# Patient Record
Sex: Female | Born: 1950 | Race: White | Hispanic: No | Marital: Married | Smoking: Never smoker
Health system: Southern US, Community
[De-identification: ages and names within clinical notes are randomized; demographics above are authoritative.]

## PROBLEM LIST (undated history)

## (undated) DIAGNOSIS — K529 Noninfective gastroenteritis and colitis, unspecified: Secondary | ICD-10-CM

## (undated) DIAGNOSIS — R2232 Localized swelling, mass and lump, left upper limb: Secondary | ICD-10-CM

## (undated) HISTORY — PX: ABDOMINAL HYSTERECTOMY: SHX81

## (undated) HISTORY — PX: CHOLECYSTECTOMY: SHX55

---
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
## 2006-05-13 ENCOUNTER — Encounter: Admission: RE | Admit: 2006-05-13 | Discharge: 2006-05-13 | Payer: Self-pay | Admitting: Gastroenterology

## 2006-06-03 ENCOUNTER — Encounter (INDEPENDENT_AMBULATORY_CARE_PROVIDER_SITE_OTHER): Payer: Self-pay | Admitting: Specialist

## 2006-06-03 ENCOUNTER — Ambulatory Visit (HOSPITAL_COMMUNITY): Admission: RE | Admit: 2006-06-03 | Discharge: 2006-06-03 | Payer: Self-pay | Admitting: Gastroenterology

## 2006-07-17 ENCOUNTER — Ambulatory Visit (HOSPITAL_COMMUNITY): Admission: RE | Admit: 2006-07-17 | Discharge: 2006-07-17 | Payer: Self-pay | Admitting: Gastroenterology

## 2006-08-06 ENCOUNTER — Encounter: Admission: RE | Admit: 2006-08-06 | Discharge: 2006-08-06 | Payer: Self-pay | Admitting: Gastroenterology

## 2008-07-09 ENCOUNTER — Emergency Department (HOSPITAL_COMMUNITY): Admission: EM | Admit: 2008-07-09 | Discharge: 2008-07-09 | Payer: Self-pay | Admitting: Emergency Medicine

## 2010-06-11 LAB — POCT URINALYSIS DIP (DEVICE)
Nitrite: NEGATIVE
Protein, ur: NEGATIVE mg/dL
Urobilinogen, UA: 0.2 mg/dL (ref 0.0–1.0)
pH: 5 (ref 5.0–8.0)

## 2010-07-19 NOTE — Op Note (Signed)
NAME:  Angela Ortega, Angela Ortega               ACCOUNT NO.:  [id number]   MEDICAL RECORD NO.:  [id number]          PATIENT TYPE:  AMB   LOCATION:  ENDO                         FACILITY:  [facility]   PHYSICIAN:  Anselmo Rod, M.D.  DATE OF BIRTH:  [birth year]   DATE OF PROCEDURE:  06/03/2006  DATE OF DISCHARGE:                               OPERATIVE REPORT   PROCEDURE PERFORMED:  Colonoscopy with multiple cold biopsies.   ENDOSCOPIST:  Anselmo Rod, M.D.   INSTRUMENT USED:  Pentax video colonoscope.   INDICATIONS FOR PROCEDURE:  A 60 year old white female with a history of  abnormal weight loss (30 pounds in the last 9 months) with abdominal  pain and diarrhea, undergoing colonoscopy to rule out colonic polyps,  masses, etc.   PREPROCEDURE PREPARATION:  Informed consent was procured from the  patient.  The patient was fasted for 4 hours prior to the procedure and  prepped with 20 OsmoPrep pills the night of and 12 OsmoPrep pills the  morning of the procedure.  The risks and benefits of the procedure  including a 10% miss rate of cancer and polyps was discussed with the  patient as well.   PREPROCEDURE PHYSICAL:  VITAL SIGNS:  The patient had stable vital  signs.  NECK:  Supple.  CHEST:  Clear to auscultation.  S1, S2 regular.  ABDOMEN:  Soft with normal bowel sounds.   DESCRIPTION OF PROCEDURE:  The patient was placed in the left lateral  decubitus position and sedated with an additional 50 mcg of Fentanyl and  2 mg of Versed given intravenously in slow incremental doses.  Once the  patient was adequately sedated and maintained on low-flow oxygen and  continuous cardiac monitoring, the Pentax video colonoscope was advanced  from the rectum to the cecum.  The appendiceal orifice and ileocecal  valve were clearly visualized and photographed.  After multiple washes  there was some residual stool in the colon.  Multiple random colon  biopsies were done to rule out collagenous colitis.   Sigmoid  diverticulosis was noted.  Retroflexion in the rectum revealed no  abnormalities except for small internal hemorrhoids.  The patient  tolerated the procedure well without complication.  The terminal ileum  appeared normal.   IMPRESSION:  1. Sigmoid diverticulosis.  2. Small internal hemorrhoids.  3. Significant amount of residual stool in the colon.  Small lesions      could be missed.  4. Multiple random colon biopsies done to rule out collagenous      colitis.   RECOMMENDATIONS:  1. Await pathology results.  2. Avoid all nonsteroidals including aspirin for the next 2 weeks.  3. Outpatient follow-up in the next 2 weeks for further      recommendations.      Anselmo Rod, M.D.  Electronically Signed     JNM/MEDQ  D:  06/03/2006  T:  06/03/2006  Job:  [id number]   cc:   Brett Canales A. Cleta Alberts, M.D.

## 2010-07-19 NOTE — Op Note (Signed)
NAME:  Angela Ortega, Angela Ortega               ACCOUNT NO.:  [id number]   MEDICAL RECORD NO.:  [id number]          PATIENT TYPE:  AMB   LOCATION:  ENDO                         FACILITY:  [facility]   PHYSICIAN:  Anselmo Rod, M.D.  DATE OF BIRTH:  [birth year]   DATE OF PROCEDURE:  06/03/2006  DATE OF DISCHARGE:                               OPERATIVE REPORT   PROCEDURE PERFORMED:  Esophagogastroduodenoscopy with multiple cold  biopsies.   ENDOSCOPIST:  Anselmo Rod, M.D.   INSTRUMENT USED:  Pentax video panendoscope.   INDICATIONS FOR PROCEDURE:  A 60 year old white female with a history of  abnormal weight loss and abdominal pain undergoing EGD.  The patient  also had history of change in bowel habits with diarrhea in the recent  past.  Rule out esophagitis, gastritis, peptic ulcer disease celiac  sprue etc.   PREPROCEDURE PREPARATION:  Informed consent was procured from the  patient.  The patient fasted for four hours prior to the procedure.  Risks and benefits of the procedure were discussed with the patient in  great detail.   PREPROCEDURE PHYSICAL:  VITAL SIGNS:  The patient had stable vital  signs.  NECK:  Supple.  CHEST:  Clear to auscultation.  CARDIAC:  S1 and S2 regular.  ABDOMEN:  Soft with normal bowel sounds.   DESCRIPTION OF PROCEDURE:  The patient was placed in left lateral  decubitus position and sedated with 50 mcg of Fentanyl and 5 mg of  Versed given intravenously in slow incremental doses.  Once the patient  was adequately sedated and maintained on low-flow oxygen and continuous  cardiac monitoring, the Pentax video panendoscope was advanced through  the mouthpiece, over the tongue, into the esophagus under direct vision.  The entire esophagus was widely patent with no evidence of ring,  stricture, masses or esophagitis.  Two nodular lesions were seen above  the Z-line and these were biopsied to rule out Barrett's.  The scope was  then advanced into the stomach.   Patchy erythema was noted and round,  flat, reddish lesions were noted throughout the mid body of the stomach  with old heme surrounding them.  These areas were biopsied.  The exact  nature of these lesions is not clear to me.  Retroflexion in the high  cardia revealed no abnormalities.  Proximal small bowel appeared normal,  small bowel biopsies were done to rule out sprue.  The patient tolerated  the procedure well without immediate complications.   IMPRESSION:  1. Two small nodular lesions biopsied in distal esophagus.  2. Patchy erythema in mid body biopsied.  3. Proximal small bowel was normal, small bowel biopsies done to rule      out sprue.   RECOMMENDATIONS:  1. Await pathology results.  2. Avoid all nonsteroidals for now.  3. Proceed with a colonoscopy at this time.  Further recommendations      made thereafter.      Anselmo Rod, M.D.  Electronically Signed     JNM/MEDQ  D:  06/03/2006  T:  06/03/2006  Job:  [id number]   cc:  Stan [place] Cleta Alberts, M.D.

## 2011-03-24 ENCOUNTER — Ambulatory Visit (HOSPITAL_BASED_OUTPATIENT_CLINIC_OR_DEPARTMENT_OTHER)
Admission: RE | Admit: 2011-03-24 | Discharge: 2011-03-24 | Disposition: A | Discharge status: Home / Self Care | Payer: BC Managed Care – PPO | Source: Ambulatory Visit | Attending: Family Medicine | Admitting: Family Medicine

## 2011-03-24 ENCOUNTER — Other Ambulatory Visit: Payer: Self-pay | Admitting: Family Medicine

## 2011-03-24 ENCOUNTER — Ambulatory Visit (INDEPENDENT_AMBULATORY_CARE_PROVIDER_SITE_OTHER): Payer: BC Managed Care – PPO

## 2011-03-24 DIAGNOSIS — R51 Headache: Secondary | ICD-10-CM

## 2011-03-24 DIAGNOSIS — W19XXXA Unspecified fall, initial encounter: Secondary | ICD-10-CM

## 2011-03-24 DIAGNOSIS — R52 Pain, unspecified: Secondary | ICD-10-CM

## 2011-03-24 DIAGNOSIS — G44209 Tension-type headache, unspecified, not intractable: Secondary | ICD-10-CM

## 2011-03-24 DIAGNOSIS — S0990XA Unspecified injury of head, initial encounter: Secondary | ICD-10-CM

## 2011-03-24 DIAGNOSIS — R110 Nausea: Secondary | ICD-10-CM

## 2011-03-24 DIAGNOSIS — R220 Localized swelling, mass and lump, head: Secondary | ICD-10-CM | POA: Insufficient documentation

## 2011-03-24 DIAGNOSIS — R221 Localized swelling, mass and lump, neck: Secondary | ICD-10-CM | POA: Insufficient documentation

## 2012-03-19 DIAGNOSIS — Z0271 Encounter for disability determination: Secondary | ICD-10-CM

## 2012-05-02 ENCOUNTER — Ambulatory Visit (INDEPENDENT_AMBULATORY_CARE_PROVIDER_SITE_OTHER): Payer: BC Managed Care – PPO | Admitting: Family Medicine

## 2012-05-02 VITALS — BP 115/78 | HR 93 | Temp 98.0°F | Resp 16 | Ht 60.0 in | Wt 89.2 lb

## 2012-05-02 DIAGNOSIS — R197 Diarrhea, unspecified: Secondary | ICD-10-CM

## 2012-05-02 DIAGNOSIS — E860 Dehydration: Secondary | ICD-10-CM

## 2012-05-02 DIAGNOSIS — F341 Dysthymic disorder: Secondary | ICD-10-CM

## 2012-05-02 DIAGNOSIS — F418 Other specified anxiety disorders: Secondary | ICD-10-CM

## 2012-05-02 DIAGNOSIS — R634 Abnormal weight loss: Secondary | ICD-10-CM

## 2012-05-02 LAB — POCT UA - MICROSCOPIC ONLY
Casts, Ur, LPF, POC: NEGATIVE
Crystals, Ur, HPF, POC: NEGATIVE
Mucus, UA: NEGATIVE
Yeast, UA: NEGATIVE

## 2012-05-02 LAB — TSH: TSH: 1.411 u[IU]/mL (ref 0.350–4.500)

## 2012-05-02 LAB — POCT CBC
Granulocyte percent: 73.2 %G (ref 37–80)
HCT, POC: 44.6 % (ref 37.7–47.9)
Hemoglobin: 14.2 g/dL (ref 12.2–16.2)
Lymph, poc: 0.9 (ref 0.6–3.4)
MCH, POC: 29.3 pg (ref 27–31.2)
MCHC: 31.8 g/dL (ref 31.8–35.4)
MCV: 92.1 fL (ref 80–97)
MID (cbc): 0.2 (ref 0–0.9)
MPV: 11.3 fL (ref 0–99.8)
POC Granulocyte: 3.1 (ref 2–6.9)
POC LYMPH PERCENT: 21.4 % (ref 10–50)
POC MID %: 5.4 % (ref 0–12)
Platelet Count, POC: 199 10*3/uL (ref 142–424)
RBC: 4.84 M/uL (ref 4.04–5.48)
RDW, POC: 14.9 %
WBC: 4.2 10*3/uL — AB (ref 4.6–10.2)

## 2012-05-02 LAB — POCT URINALYSIS DIPSTICK
Bilirubin, UA: NEGATIVE
Glucose, UA: NEGATIVE
Ketones, UA: NEGATIVE
Nitrite, UA: NEGATIVE
Protein, UA: 80
Spec Grav, UA: 1.01
Urobilinogen, UA: 0.2
pH, UA: 5

## 2012-05-02 LAB — COMPREHENSIVE METABOLIC PANEL
ALT: 19 U/L (ref 0–35)
AST: 21 U/L (ref 0–37)
Albumin: 4.3 g/dL (ref 3.5–5.2)
Alkaline Phosphatase: 42 U/L (ref 39–117)
BUN: 12 mg/dL (ref 6–23)
CO2: 28 mEq/L (ref 19–32)
Calcium: 9.7 mg/dL (ref 8.4–10.5)
Chloride: 104 mEq/L (ref 96–112)
Creat: 0.83 mg/dL (ref 0.50–1.10)
Glucose, Bld: 96 mg/dL (ref 70–99)
Potassium: 3.8 mEq/L (ref 3.5–5.3)
Sodium: 140 mEq/L (ref 135–145)
Total Bilirubin: 0.8 mg/dL (ref 0.3–1.2)
Total Protein: 6.9 g/dL (ref 6.0–8.3)

## 2012-05-02 LAB — GLUCOSE, POCT (MANUAL RESULT ENTRY): POC Glucose: 88 mg/dl (ref 70–99)

## 2012-05-02 MED ORDER — CLONAZEPAM 0.5 MG PO TABS: 0.5000 mg | ORAL_TABLET | Freq: Two times a day (BID) | ORAL | Status: DC | PRN

## 2012-05-02 NOTE — Progress Notes (Signed)
[facility]:  Office Visit  Chief Complaint:  Chief Complaint  Patient presents with  . Diarrhea    on and off since Christmas seeing Dr Collene Mares next week    HPI: Angela Ortega is a 62 y.o. female who complains of acute on chronic nonbloody diarrhea usually after she eats, she has had chronic diarrhea since having her gallbladder removed, however since December she  has had more frequent  Episodes of diarrhea, up to 4 times a day with a 10 lb weightloss.. She has been seeing Dr. Collene Mares for this and was put on cholestyramine. That usually helps. Had a colonscopy which showed diverticualr disease. She sticks to a bland diet. Avoid fatty and sugary foods. Sxs of diarrhea started in [place]., Has had a 10 lb weight loss.   No recent antobiotic use. Mom has been home Dec 13 on hospice with a dx of GBM, she has a severly sxs bipolar brothr who lives with her mother. He has been spiraling down mentally since their mother has gotten sick. Since he lives with her mother, he has been denying hospice care at the hospice when the nurse try to come see their mother, he is having more auditory and visual halluciantions. She is in the process of working out the logisitcs of taking care of her mom, trying to get help for her brother who refuses meds, and also sorting out the financial issues with her mom. She has been on FMLA and that is running out. She has her daughter who is here from [place]  for support.   She is worried about her brother who is bipola but she is also worried about her safety when seh is over there, this is stressing her out. She sleeps with one eye open. He is having hallucinations, auditory and visual. He is not on any meds.   Past Medical History  Diagnosis Date  . Anemia    Past Surgical History  Procedure Laterality Date  . Cholecystectomy    . Abdominal hysterectomy     History   Social History  . Marital Status: Married    Spouse Name: N/A    Number of  Children: N/A  . Years of Education: N/A   Social History Main Topics  . Smoking status: Never Smoker   . Smokeless tobacco: None  . Alcohol Use: No  . Drug Use: No  . Sexually Active: Yes   Other Topics Concern  . None   Social History Narrative  . None   Family History  Problem Relation Age of Onset  . Cancer Father   . Heart disease Maternal Grandfather   . Cancer Paternal Grandfather    Allergies  Allergen Reactions  . Codeine Nausea And Vomiting  . Morphine And Related Nausea And Vomiting  . Penicillins Rash  . Septra (Sulfamethoxazole W-Trimethoprim) Rash   Prior to Admission medications   Medication Sig Start Date End Date Taking? Authorizing Provider  CHOLESTYRAMINE PO Take 9 g by mouth. 1 to 2 daily   Yes Historical Provider, MD     ROS: The patient denies fevers, chills, night sweats, chest pain, palpitations, wheezing, dyspnea on exertion, nausea, vomiting, abdominal pain, dysuria, hematuria, melena, numbness,  or tingling.   All other systems have been reviewed and were otherwise negative with the exception of those mentioned in the HPI and as above.    PHYSICAL EXAM: Filed Vitals:   05/02/12 1043  BP: 115/78  Pulse: 93  Temp: 98  F (36.7 C)  Resp: 16   Filed Vitals:   05/02/12 1043  Height: 5' (1.524 m)  Weight: 89 lb 3.2 oz (40.461 kg)   Body mass index is 17.42 kg/(m^2).  General: Alert, no acute distress, thin white female HEENT:  Normocephalic, atraumatic, oropharynx patent.  Cardiovascular:  Regular rate and rhythm, no rubs murmurs or gallops.  No Carotid bruits, radial pulse intact. No pedal edema.  Respiratory: Clear to auscultation bilaterally.  No wheezes, rales, or rhonchi.  No cyanosis, no use of accessory musculature GI: No organomegaly, abdomen is soft and non-tender, positive bowel sounds.  No masses. Skin: No rashes. Neurologic: Facial musculature symmetric. Psychiatric: Patient is appropriate throughout our  interaction. Lymphatic: No cervical lymphadenopathy Musculoskeletal: Gait intact.   LABS: Results for orders placed in visit on 05/02/12  POCT CBC      Result Value Range   WBC 4.2 (*) 4.6 - 10.2 K/uL   Lymph, poc 0.9  0.6 - 3.4   POC LYMPH PERCENT 21.4  10 - 50 %L   MID (cbc) 0.2  0 - 0.9   POC MID % 5.4  0 - 12 %M   POC Granulocyte 3.1  2 - 6.9   Granulocyte percent 73.2  37 - 80 %G   RBC 4.84  4.04 - 5.48 M/uL   Hemoglobin 14.2  12.2 - 16.2 g/dL   HCT, POC 44.6  37.7 - 47.9 %   MCV 92.1  80 - 97 fL   MCH, POC 29.3  27 - 31.2 pg   MCHC 31.8  31.8 - 35.4 g/dL   RDW, POC 14.9     Platelet Count, POC 199  142 - 424 K/uL   MPV 11.3  0 - 99.8 fL  GLUCOSE, POCT (MANUAL RESULT ENTRY)      Result Value Range   POC Glucose 88  70 - 99 mg/dl     EKG/XRAY:   Primary read interpreted by Dr. Marin Comment at [facility].   ASSESSMENT/PLAN: Encounter Diagnoses  Name Primary?  . Diarrhea Yes  . Depression with anxiety   . Loss of weight    No e/o C. Diff, diverticulitis/losis. I believe most of her increasing diarrhea may be stress related, brother with worsening bipolar sx who is refusing medication, mother in hospice with GBM, and financial duress with her mom's estate. Mom took a second mrtgage out onhouse that she did not know about.  Rx klonopin  She will call me back in 5 days to let me know how she is doing on the klonopin.  I was considering adding on Remeron to see if this may help with possible depression sxs and also help appetite if klonopin does not help. Antidepressants have SEs of diarrhea so wanted to try Klonopin first. Remeron doe snot to appear have diarrhea but SSRIs do.  Her Zung Anxiety Index  score is 49-minimal to moderate anxiety Her Zung Depression score is 47. < 50 for clinical depression F/u prn  Or in 1 month with Dr. Arlington Calix, [facility], DO 05/02/2012 12:08 PM

## 2012-05-06 ENCOUNTER — Ambulatory Visit (INDEPENDENT_AMBULATORY_CARE_PROVIDER_SITE_OTHER): Payer: BC Managed Care – PPO | Admitting: Family Medicine

## 2012-05-06 VITALS — BP 110/78 | HR 77 | Temp 98.0°F | Resp 16 | Ht 59.0 in | Wt 90.0 lb

## 2012-05-06 DIAGNOSIS — R42 Dizziness and giddiness: Secondary | ICD-10-CM

## 2012-05-06 DIAGNOSIS — E860 Dehydration: Secondary | ICD-10-CM

## 2012-05-06 DIAGNOSIS — R110 Nausea: Secondary | ICD-10-CM

## 2012-05-06 MED ORDER — ONDANSETRON 4 MG PO TBDP: 4.0000 mg | ORAL_TABLET | Freq: Three times a day (TID) | ORAL | Status: DC | PRN

## 2012-05-06 MED ORDER — ONDANSETRON 4 MG PO TBDP
4.0000 mg | ORAL_TABLET | Freq: Once | ORAL | Status: AC
  Administered 2012-05-06: 4 mg via ORAL

## 2012-05-06 NOTE — Patient Instructions (Signed)
Diarrhea Infections caused by germs (bacterial) or a virus commonly cause diarrhea. Your caregiver has determined that with time, rest and fluids, the diarrhea should improve. In general, eat normally while drinking more water than usual. Although water may prevent dehydration, it does not contain salt and minerals (electrolytes). Broths, weak tea without caffeine and oral rehydration solutions (ORS) replace fluids and electrolytes. Small amounts of fluids should be taken frequently. Large amounts at one time may not be tolerated. Plain water may be harmful in infants and the elderly. Oral rehydrating solutions (ORS) are available at pharmacies and grocery stores. ORS replace water and important electrolytes in proper proportions. Sports drinks are not as effective as ORS and may be harmful due to sugars worsening diarrhea.  ORS is especially recommended for use in children with diarrhea. As a general guideline for children, replace any new fluid losses from diarrhea and/or vomiting with ORS as follows:  If your child weighs 22 pounds or under (10 kg or less), give 60-120 mL ( -  cup or 2 - 4 ounces) of ORS for each episode of diarrheal stool or vomiting episode.  If your child weighs more than 22 pounds (more than 10 kgs), give 120-240 mL ( - 1 cup or 4 - 8 ounces) of ORS for each diarrheal stool or episode of vomiting.  While correcting for dehydration, children should eat normally. However, foods high in sugar should be avoided because this may worsen diarrhea. Large amounts of carbonated soft drinks, juice, gelatin desserts and other highly sugared drinks should be avoided.  After correction of dehydration, other liquids that are appealing to the child may be added. Children should drink small amounts of fluids frequently and fluids should be increased as tolerated. Children should drink enough fluids to keep urine clear or pale yellow.  Adults should eat normally while drinking more fluids than  usual. Drink small amounts of fluids frequently and increase as tolerated. Drink enough fluids to keep urine clear or pale yellow. Broths, weak decaffeinated tea, lemon lime soft drinks (allowed to go flat) and ORS replace fluids and electrolytes.  Avoid:  Carbonated drinks.  Juice.  Extremely hot or cold fluids.  Caffeine drinks.  Fatty, greasy foods.  Alcohol.  Tobacco.  Too much intake of anything at one time.  Gelatin desserts.  Probiotics are active cultures of beneficial bacteria. They may lessen the amount and number of diarrheal stools in adults. Probiotics can be found in yogurt with active cultures and in supplements.  Wash hands well to avoid spreading bacteria and virus.  Anti-diarrheal medications are not recommended for infants and children.  Only take over-the-counter or prescription medicines for pain, discomfort or fever as directed by your caregiver. Do not give aspirin to children because it may cause Reye's Syndrome.  For adults, ask your caregiver if you should continue all prescribed and over-the-counter medicines.  If your caregiver has given you a follow-up appointment, it is very important to keep that appointment. Not keeping the appointment could result in a chronic or permanent injury, and disability. If there is any problem keeping the appointment, you must call back to this facility for assistance. SEEK IMMEDIATE MEDICAL CARE IF:   You or your child is unable to keep fluids down or other symptoms or problems become worse in spite of treatment.  Vomiting or diarrhea develops and becomes persistent.  There is vomiting of blood or bile (green material).  There is blood in the stool or the stools are black and   tarry.  There is no urine output in 6-8 hours or there is only a small amount of very dark urine.  Abdominal pain develops, increases or localizes.  You have a fever.  Your baby is older than 3 months with a rectal temperature of 102 F  (38.9 C) or higher.  Your baby is 3 months old or younger with a rectal temperature of 100.4 F (38 C) or higher.  You or your child develops excessive weakness, dizziness, fainting or extreme thirst.  You or your child develops a rash, stiff neck, severe headache or become irritable or sleepy and difficult to awaken. MAKE SURE YOU:   Understand these instructions.  Will watch your condition.  Will get help right away if you are not doing well or get worse. Document Released: 02/07/2002 Document Revised: 05/12/2011 Document Reviewed: 12/25/2008 Exit[employer] Patient Information 2013 [employer].  

## 2012-05-06 NOTE — Progress Notes (Signed)
[facility]:  Office Visit  Chief Complaint:  Chief Complaint  Patient presents with  . Follow-up    N&D    HPI: Angela Ortega is a 62 y.o. female who complains of  Here for similar sxs of diarrhea with dizziness and not able to eat. She as dizzy when she was washingher hair and was moving up and down posturally to do this. This was a little but more exacerbated since she had to undergo prep for colonoscopy yesterday, per patient she said Dr. Loreta Ave says her colonoscopy was unchanged from 2008, however in case this is colitis she gave Angela Ortega a sample of budesonide to take for 2 weeks until she is re-evaluated in her office in 2 weeks. . She has had minimal/ to trace bloody mucus tinged diarrhea x 2 episodes after colonoscopy. Per GI nurse this is normal. Last blood tinge diarrhea was last night after colonsocpy. Bx pending. Recents labs were normal except for possible contaminant in urine. She denies having any fevers, chills, urinary sxs.   Past Medical History  Diagnosis Date  . Anemia    Past Surgical History  Procedure Laterality Date  . Cholecystectomy    . Abdominal hysterectomy     History   Social History  . Marital Status: Married    Spouse Name: N/A    Number of Children: N/A  . Years of Education: N/A   Social History Main Topics  . Smoking status: Never Smoker   . Smokeless tobacco: None  . Alcohol Use: No  . Drug Use: No  . Sexually Active: Yes   Other Topics Concern  . None   Social History Narrative  . None   Family History  Problem Relation Age of Onset  . Cancer Father   . Heart disease Maternal Grandfather   . Cancer Paternal Grandfather    Allergies  Allergen Reactions  . Codeine Nausea And Vomiting  . Morphine And Related Nausea And Vomiting  . Penicillins Rash  . Septra (Sulfamethoxazole W-Trimethoprim) Rash   Prior to Admission medications   Medication Sig Start Date End Date Taking? Authorizing Provider   CHOLESTYRAMINE PO Take 9 g by mouth. 1 to 2 daily    Historical Provider, MD  clonazePAM (KLONOPIN) 0.5 MG tablet Take 1 tablet (0.5 mg total) by mouth 2 (two) times daily as needed for anxiety. 05/02/12   Thao P Le, DO     ROS: The patient denies fevers, chills, night sweats, unintentional weight loss, chest pain, palpitations, wheezing, dyspnea on exertion, nausea, vomiting, abdominal pain, dysuria, hematuria, melena, numbness, weakness, or tingling.  All other systems have been reviewed and were otherwise negative with the exception of those mentioned in the HPI and as above.    PHYSICAL EXAM: Filed Vitals:   05/06/12 1659  BP: 110/78  Pulse: 77  Temp: 98 F (36.7 C)  Resp: 16   Filed Vitals:   05/06/12 1659  Height: 4\' 11"  (1.499 m)  Weight: 90 lb (40.824 kg)   Body mass index is 18.17 kg/(m^2).  General: Alert, no acute distress. Thin frail appearing female HEENT:  Normocephalic, atraumatic, oropharynx patent. Oral mucosa dry Cardiovascular:  Regular rate and rhythm, no rubs murmurs or gallops.  No Carotid bruits, radial pulse intact. No pedal edema.  Respiratory: Clear to auscultation bilaterally.  No wheezes, rales, or rhonchi.  No cyanosis, no use of accessory musculature GI: No organomegaly, abdomen is soft and non-tender, positive bowel sounds.  No masses.  Skin: No rashes. Neurologic: Facial musculature symmetric. Psychiatric: Patient is appropriate throughout our interaction. Lymphatic: No cervical lymphadenopathy Musculoskeletal: Gait intact.   LABS: Results for orders placed in visit on 05/02/12  COMPREHENSIVE METABOLIC PANEL      Result Value Range   Sodium 140  135 - 145 mEq/L   Potassium 3.8  3.5 - 5.3 mEq/L   Chloride 104  96 - 112 mEq/L   CO2 28  19 - 32 mEq/L   Glucose, Bld 96  70 - 99 mg/dL   BUN 12  6 - 23 mg/dL   Creat 9.60  4.54 - 0.98 mg/dL   Total Bilirubin 0.8  0.3 - 1.2 mg/dL   Alkaline Phosphatase 42  39 - 117 U/L   AST 21  0 - 37 U/L    ALT 19  0 - 35 U/L   Total Protein 6.9  6.0 - 8.3 g/dL   Albumin 4.3  3.5 - 5.2 g/dL   Calcium 9.7  8.4 - 11.9 mg/dL  TSH      Result Value Range   TSH 1.411  0.350 - 4.500 uIU/mL  POCT CBC      Result Value Range   WBC 4.2 (*) 4.6 - 10.2 K/uL   Lymph, poc 0.9  0.6 - 3.4   POC LYMPH PERCENT 21.4  10 - 50 %L   MID (cbc) 0.2  0 - 0.9   POC MID % 5.4  0 - 12 %M   POC Granulocyte 3.1  2 - 6.9   Granulocyte percent 73.2  37 - 80 %G   RBC 4.84  4.04 - 5.48 M/uL   Hemoglobin 14.2  12.2 - 16.2 g/dL   HCT, POC 14.7  82.9 - 47.9 %   MCV 92.1  80 - 97 fL   MCH, POC 29.3  27 - 31.2 pg   MCHC 31.8  31.8 - 35.4 g/dL   RDW, POC 56.2     Platelet Count, POC 199  142 - 424 K/uL   MPV 11.3  0 - 99.8 fL  POCT UA - MICROSCOPIC ONLY      Result Value Range   WBC, Ur, HPF, POC 1-3     RBC, urine, microscopic 1-3     Bacteria, U Microscopic trace     Mucus, UA neg     Epithelial cells, urine per micros 0-1     Crystals, Ur, HPF, POC neg     Casts, Ur, LPF, POC neg     Yeast, UA neg    POCT URINALYSIS DIPSTICK      Result Value Range   Color, UA yellow     Clarity, UA clear     Glucose, UA neg     Bilirubin, UA neg     Ketones, UA neg     Spec Grav, UA 1.010     Blood, UA trace-lysed     pH, UA 5.0     Protein, UA 80     Urobilinogen, UA 0.2     Nitrite, UA neg     Leukocytes, UA Trace    GLUCOSE, POCT (MANUAL RESULT ENTRY)      Result Value Range   POC Glucose 88  70 - 99 mg/dl     EKG/XRAY:   Primary read interpreted by Dr. Conley Rolls at [facility].   ASSESSMENT/PLAN: Encounter Diagnoses  Name Primary?  . Dehydration Yes  . Nausea alone   . Dizziness and giddiness    NS IVF  x 2 bags--feels better after fluids and also 1x dose of zofran odt No labs today-last CBC, CMP less than 1 week ago was normal, she has no new sxs. Her urine showed trace leuks, I suspect it is a contaminant but will treat with 3 days of Cipro to cover all bases since she is having N/dizziness. Unable to cx at this  time since urine already thrown out.  Rx Zofran ODT Awaiting for colonoscopy bx, results so far no change from prior in 2008 per patient according to what Dr. Loreta Ave told her. F/u with Dr. Loreta Ave in 2 weeks as directed. F/u with Dr. Audria Nine regarding klonopin use.  She is tolerating Klonopin 1 tab PO daily very well.     Hamilton Capri PHUONG, DO 05/06/2012 6:15 PM

## 2012-05-08 MED ORDER — CIPROFLOXACIN HCL 250 MG PO TABS: 250.0000 mg | ORAL_TABLET | Freq: Two times a day (BID) | ORAL | Status: DC

## 2012-05-11 ENCOUNTER — Telehealth: Payer: Self-pay | Admitting: Family Medicine

## 2012-05-11 NOTE — Telephone Encounter (Signed)
Message copied by Gerrianne Scale on Tue May 11, 2012  2:23 PM ------      Message from: LE, [place] P      Created: Thu May 06, 2012  3:46 PM       Please let patient know electrolytes, kidneys,  Liver , thyroid are normal. Please ask her about her klonopin use and also how her visit with Dr. Loreta Ave went.            Thanks,      Tle ------

## 2012-05-11 NOTE — Telephone Encounter (Signed)
Spoke with patient she is much better with the half of tab of klonopin also she doesn't see Dr Loreta Ave until next Monday  She can keep food down and that they finally put her mother into [facility]

## 2012-05-11 NOTE — Telephone Encounter (Signed)
Message copied by Gerrianne Scale on Tue May 11, 2012  2:25 PM ------      Message from: LE, [place] P      Created: Thu May 06, 2012  3:46 PM       Please let patient know electrolytes, kidneys,  Liver , thyroid are normal. Please ask her about her klonopin use and also how her visit with Dr. Loreta Ave went.            Thanks,      Tle ------

## 2012-05-26 ENCOUNTER — Encounter: Payer: Self-pay | Admitting: Emergency Medicine

## 2012-10-13 ENCOUNTER — Encounter: Payer: Self-pay | Admitting: Family Medicine

## 2013-02-07 ENCOUNTER — Ambulatory Visit (INDEPENDENT_AMBULATORY_CARE_PROVIDER_SITE_OTHER): Payer: BC Managed Care – PPO | Admitting: Family Medicine

## 2013-02-07 VITALS — BP 122/80 | HR 65 | Temp 98.0°F | Resp 16 | Ht 60.0 in | Wt 103.0 lb

## 2013-02-07 DIAGNOSIS — S39012A Strain of muscle, fascia and tendon of lower back, initial encounter: Secondary | ICD-10-CM

## 2013-02-07 DIAGNOSIS — F418 Other specified anxiety disorders: Secondary | ICD-10-CM

## 2013-02-07 DIAGNOSIS — F341 Dysthymic disorder: Secondary | ICD-10-CM

## 2013-02-07 DIAGNOSIS — IMO0002 Reserved for concepts with insufficient information to code with codable children: Secondary | ICD-10-CM

## 2013-02-07 DIAGNOSIS — M549 Dorsalgia, unspecified: Secondary | ICD-10-CM

## 2013-02-07 MED ORDER — CLONAZEPAM 0.5 MG PO TABS: 0.5000 mg | ORAL_TABLET | Freq: Two times a day (BID) | ORAL | Status: DC | PRN

## 2013-02-07 NOTE — Progress Notes (Signed)
Chief Complaint:  Chief Complaint  Patient presents with  . Back Pain    lbp x 1 week  . Hip Pain    right more that left    HPI: Angela Ortega is a 62 y.o. female who is here for 1 week history of low back pain, NKI Was sharp with arching and flexing, no urinary or bowel incontinence, no fevers or chils, or saddle anesthesia No weakness. Currently the pain is nagging and dull, and flares up occasionally, she has gotten relief after a good nights sleep when she takes her Klonopin.  Had a tick bite embedded on her left upper arm and was treated with Doxycycline No fever or chills. Target lesion rash is gone.   She works with [employer]--they are currently in a pertussis outbreak. But her vaccines are all UTD.   She has stress and some anxiety, she takes klonopin prn. She states it allows to help her sleep and also relaxes her muscle spasms, she took 2x this week and it helped a lot.   Past Medical History  Diagnosis Date  . Anemia    Past Surgical History  Procedure Laterality Date  . Cholecystectomy    . Abdominal hysterectomy     History   Social History  . Marital Status: Married    Spouse Name: N/A    Number of Children: N/A  . Years of Education: N/A   Social History Main Topics  . Smoking status: Never Smoker   . Smokeless tobacco: None  . Alcohol Use: No  . Drug Use: No  . Sexual Activity: Yes   Other Topics Concern  . None   Social History Narrative  . None   Family History  Problem Relation Age of Onset  . Cancer Father   . Heart disease Maternal Grandfather   . Cancer Paternal Grandfather    Allergies  Allergen Reactions  . Codeine Nausea And Vomiting  . Morphine And Related Nausea And Vomiting  . Penicillins Rash  . Septra [Sulfamethoxazole-Trimethoprim] Rash   Prior to Admission medications   Medication Sig Start Date End Date Taking? Authorizing Provider  CHOLESTYRAMINE PO Take 9 g by mouth. 1 to 2 daily   Yes  Historical Provider, MD  clonazePAM (KLONOPIN) 0.5 MG tablet Take 1 tablet (0.5 mg total) by mouth 2 (two) times daily as needed for anxiety. 05/02/12  Yes Steffie Waggoner P Kayse Puccini, DO  ciprofloxacin (CIPRO) 250 MG tablet Take 1 tablet (250 mg total) by mouth 2 (two) times daily. 05/08/12   Alberto Pina P Phu Record, DO  ondansetron (ZOFRAN ODT) 4 MG disintegrating tablet Take 1 tablet (4 mg total) by mouth every 8 (eight) hours as needed for nausea. 05/06/12   Ainslie Mazurek P Quentavious Rittenhouse, DO     ROS: The patient denies fevers, chills, night sweats, unintentional weight loss, chest pain, palpitations, wheezing, dyspnea on exertion, nausea, vomiting, abdominal pain, dysuria, hematuria, melena, numbness, weakness, or tingling.   All other systems have been reviewed and were otherwise negative with the exception of those mentioned in the HPI and as above.    PHYSICAL EXAM: Filed Vitals:   02/07/13 1315  BP: 122/80  Pulse: 65  Temp: 98 F (36.7 C)  Resp: 16   Filed Vitals:   02/07/13 1315  Height: 5' (1.524 m)  Weight: 103 lb (46.72 kg)   Body mass index is 20.12 kg/(m^2).  General: Alert, no acute distress HEENT:  Normocephalic, atraumatic, oropharynx patent. EOMI, PERRLA  Cardiovascular:  Regular rate and rhythm, no rubs murmurs or gallops.  No Carotid bruits, radial pulse intact. No pedal edema.  Respiratory: Clear to auscultation bilaterally.  No wheezes, rales, or rhonchi.  No cyanosis, no use of accessory musculature GI: No organomegaly, abdomen is soft and non-tender, positive bowel sounds.  No masses. Skin: No rashes. Neurologic: Facial musculature symmetric. Psychiatric: Patient is appropriate throughout our interaction. Lymphatic: No cervical lymphadenopathy Musculoskeletal: Gait intact. Full ROM 5/5 strength, no saddle anesthesia Sensation intact + tender along bilateral paramsk  Neg straight leg   LABS: Results for orders placed in visit on 05/02/12  COMPREHENSIVE METABOLIC PANEL      Result Value Range   Sodium 140   135 - 145 mEq/L   Potassium 3.8  3.5 - 5.3 mEq/L   Chloride 104  96 - 112 mEq/L   CO2 28  19 - 32 mEq/L   Glucose, Bld 96  70 - 99 mg/dL   BUN 12  6 - 23 mg/dL   Creat 8.29  5.62 - 1.30 mg/dL   Total Bilirubin 0.8  0.3 - 1.2 mg/dL   Alkaline Phosphatase 42  39 - 117 U/L   AST 21  0 - 37 U/L   ALT 19  0 - 35 U/L   Total Protein 6.9  6.0 - 8.3 g/dL   Albumin 4.3  3.5 - 5.2 g/dL   Calcium 9.7  8.4 - 86.5 mg/dL  TSH      Result Value Range   TSH 1.411  0.350 - 4.500 uIU/mL  POCT CBC      Result Value Range   WBC 4.2 (*) 4.6 - 10.2 K/uL   Lymph, poc 0.9  0.6 - 3.4   POC LYMPH PERCENT 21.4  10 - 50 %L   MID (cbc) 0.2  0 - 0.9   POC MID % 5.4  0 - 12 %M   POC Granulocyte 3.1  2 - 6.9   Granulocyte percent 73.2  37 - 80 %G   RBC 4.84  4.04 - 5.48 M/uL   Hemoglobin 14.2  12.2 - 16.2 g/dL   HCT, POC 78.4  69.6 - 47.9 %   MCV 92.1  80 - 97 fL   MCH, POC 29.3  27 - 31.2 pg   MCHC 31.8  31.8 - 35.4 g/dL   RDW, POC 29.5     Platelet Count, POC 199  142 - 424 K/uL   MPV 11.3  0 - 99.8 fL  POCT UA - MICROSCOPIC ONLY      Result Value Range   WBC, Ur, HPF, POC 1-3     RBC, urine, microscopic 1-3     Bacteria, U Microscopic trace     Mucus, UA neg     Epithelial cells, urine per micros 0-1     Crystals, Ur, HPF, POC neg     Casts, Ur, LPF, POC neg     Yeast, UA neg    POCT URINALYSIS DIPSTICK      Result Value Range   Color, UA yellow     Clarity, UA clear     Glucose, UA neg     Bilirubin, UA neg     Ketones, UA neg     Spec Grav, UA 1.010     Blood, UA trace-lysed     pH, UA 5.0     Protein, UA 80     Urobilinogen, UA 0.2     Nitrite, UA neg  Leukocytes, UA Trace    GLUCOSE, POCT (MANUAL RESULT ENTRY)      Result Value Range   POC Glucose 88  70 - 99 mg/dl     EKG/XRAY:   Primary read interpreted by Dr. Conley Rolls at [facility].   ASSESSMENT/PLAN: Encounter Diagnoses  Name Primary?  . Back pain Yes  . Sprain and strain of back, initial encounter   . Depression with  anxiety    Msk sprain and strain, she does carry a laptop with her and this may have been the trigger, she is using a rolling bag now.  We discussed the different options of treatment and since the Klonopin did help her, it may be better to just continue with it since it helps with her anxiety.stress and also muscles spasms Refills on  Klonopin F/u prn  Gross sideeffects, risk and benefits, and alternatives of medications d/w patient. Patient is aware that all medications have potential sideeffects and we are unable to predict every sideeffect or drug-drug interaction that may occur.  Hamilton Capri PHUONG, DO 02/07/2013 2:32 PM

## 2013-02-15 ENCOUNTER — Ambulatory Visit (INDEPENDENT_AMBULATORY_CARE_PROVIDER_SITE_OTHER): Payer: BC Managed Care – PPO | Admitting: Family Medicine

## 2013-02-15 ENCOUNTER — Ambulatory Visit: Payer: BC Managed Care – PPO

## 2013-02-15 VITALS — BP 98/62 | HR 76 | Temp 98.0°F | Resp 16 | Ht 60.0 in | Wt 100.0 lb

## 2013-02-15 DIAGNOSIS — G8929 Other chronic pain: Secondary | ICD-10-CM

## 2013-02-15 DIAGNOSIS — R300 Dysuria: Secondary | ICD-10-CM

## 2013-02-15 DIAGNOSIS — R1031 Right lower quadrant pain: Secondary | ICD-10-CM

## 2013-02-15 LAB — POCT CBC
Granulocyte percent: 69.7 %G (ref 37–80)
HCT, POC: 45.3 % (ref 37.7–47.9)
Hemoglobin: 13.5 g/dL (ref 12.2–16.2)
Lymph, poc: 0.9 (ref 0.6–3.4)
MCH, POC: 28.5 pg (ref 27–31.2)
MCHC: 29.8 g/dL — AB (ref 31.8–35.4)
MCV: 95.6 fL (ref 80–97)
MID (cbc): 0.3 (ref 0–0.9)
MPV: 10.9 fL (ref 0–99.8)
POC Granulocyte: 2.9 (ref 2–6.9)
POC LYMPH PERCENT: 22.5 %L (ref 10–50)
POC MID %: 7.8 %M (ref 0–12)
Platelet Count, POC: 140 10*3/uL — AB (ref 142–424)
RBC: 4.74 M/uL (ref 4.04–5.48)
RDW, POC: 14.8 %
WBC: 4.1 10*3/uL — AB (ref 4.6–10.2)

## 2013-02-15 LAB — POCT UA - MICROSCOPIC ONLY
Casts, Ur, LPF, POC: NEGATIVE
Yeast, UA: NEGATIVE

## 2013-02-15 LAB — POCT URINALYSIS DIPSTICK
Glucose, UA: NEGATIVE
Nitrite, UA: NEGATIVE
Urobilinogen, UA: 0.2

## 2013-02-15 NOTE — Progress Notes (Signed)
62 yo with right flank pain x 1 week.  No relief with changing position.  Pain radiates from right flank into right groin  She has had some nausea and diarrhea over the weekend.  No fever or chills, but does feel cold.  Patient works as a Radiation protection practitioner, working at [employer]..  No trauma or change in activity.    S/P cholecystectomy and hysterectomy  Objective:  NAD Chest:  Clear Heart: reg, no murmur Abdomen:  Mild Right sided tenderness with mild rebound, no guarding, no masses or HSM  Results for orders placed in visit on 02/15/13  POCT UA - MICROSCOPIC ONLY      Result Value Range   WBC, Ur, HPF, POC 0-3     RBC, urine, microscopic 0-1     Bacteria, U Microscopic trace     Mucus, UA neg     Epithelial cells, urine per micros 0-1     Crystals, Ur, HPF, POC neg     Casts, Ur, LPF, POC neg     Yeast, UA neg    POCT URINALYSIS DIPSTICK      Result Value Range   Color, UA yellow     Clarity, UA clear     Glucose, UA neg     Bilirubin, UA neg     Ketones, UA neg     Spec Grav, UA 1.010     Blood, UA neg     pH, UA 5.0     Protein, UA neg     Urobilinogen, UA 0.2     Nitrite, UA neg     Leukocytes, UA Negative    POCT CBC      Result Value Range   WBC 4.1 (*) 4.6 - 10.2 K/uL   Lymph, poc 0.9  0.6 - 3.4   POC LYMPH PERCENT 22.5  10 - 50 %L   MID (cbc) 0.3  0 - 0.9   POC MID % 7.8  0 - 12 %M   POC Granulocyte 2.9  2 - 6.9   Granulocyte percent 69.7  37 - 80 %G   RBC 4.74  4.04 - 5.48 M/uL   Hemoglobin 13.5  12.2 - 16.2 g/dL   HCT, POC 47.8  29.5 - 47.9 %   MCV 95.6  80 - 97 fL   MCH, POC 28.5  27 - 31.2 pg   MCHC 29.8 (*) 31.8 - 35.4 g/dL   RDW, POC 62.1     Platelet Count, POC 140 (*) 142 - 424 K/uL   MPV 10.9  0 - 99.8 fL   [facility] reading (PRIMARY) by  Dr. Terrial Rhodes gas pattern, normal spine.  Assessment: Constipation Plan: MiraLax, return if pain continues after 48 hours Signed, Elvina Sidle M.D.

## 2013-02-20 ENCOUNTER — Ambulatory Visit (INDEPENDENT_AMBULATORY_CARE_PROVIDER_SITE_OTHER): Payer: BC Managed Care – PPO | Admitting: Family Medicine

## 2013-02-20 ENCOUNTER — Ambulatory Visit: Payer: BC Managed Care – PPO

## 2013-02-20 VITALS — BP 120/80 | HR 60 | Temp 98.0°F | Resp 16 | Ht 60.0 in | Wt 100.0 lb

## 2013-02-20 DIAGNOSIS — R109 Unspecified abdominal pain: Secondary | ICD-10-CM

## 2013-02-20 DIAGNOSIS — R110 Nausea: Secondary | ICD-10-CM

## 2013-02-20 DIAGNOSIS — R509 Fever, unspecified: Secondary | ICD-10-CM

## 2013-02-20 DIAGNOSIS — K649 Unspecified hemorrhoids: Secondary | ICD-10-CM

## 2013-02-20 DIAGNOSIS — R197 Diarrhea, unspecified: Secondary | ICD-10-CM

## 2013-02-20 LAB — POCT CBC
Granulocyte percent: 68.6 %G (ref 37–80)
HCT, POC: 45.8 % (ref 37.7–47.9)
Hemoglobin: 14.4 g/dL (ref 12.2–16.2)
Lymph, poc: 1.5 (ref 0.6–3.4)
MCH, POC: 29.5 pg (ref 27–31.2)
MCHC: 31.4 g/dL — AB (ref 31.8–35.4)
MCV: 93.8 fL (ref 80–97)
MID (cbc): 0.3 (ref 0–0.9)
MPV: 10.6 fL (ref 0–99.8)
POC Granulocyte: 3.8 (ref 2–6.9)
POC LYMPH PERCENT: 26.1 % (ref 10–50)
POC MID %: 5.3 %M (ref 0–12)
Platelet Count, POC: 191 10*3/uL (ref 142–424)
RBC: 4.88 M/uL (ref 4.04–5.48)
RDW, POC: 14.4 %
WBC: 5.6 10*3/uL (ref 4.6–10.2)

## 2013-02-20 MED ORDER — HYDROCORTISONE 2.5 % RE CREA: 1.0000 "application " | TOPICAL_CREAM | Freq: Two times a day (BID) | RECTAL | Status: DC

## 2013-02-20 NOTE — Progress Notes (Signed)
Chief Complaint:  Chief Complaint  Patient presents with  . Diarrhea    x 1 week off and on  . Nausea  . Fever    low grade  . Hemorrhoids    HPI: Angela Ortega is a 62 y.o. female who is here for diarrhea x1 week. Has had chills but denies fever but today had low grade fever. Was seen by Dr. Milus Glazier 2 weeks ago for back pain, he ordered x ray which showed she may be having constipation with some leakage of stool around impaction. She has been taking miralax. She was told to take Miralax and that would help with that. Has not been able to eat normally due to loose stool. Today she has eaten a few crackers and drank juice. Has noticed hemorrhoid since issue with loose stool. She is tired, not able to eat. She may need FMLA paperwork fileld out for her since she has had this.   Past Medical History  Diagnosis Date  . Anemia    Past Surgical History  Procedure Laterality Date  . Cholecystectomy    . Abdominal hysterectomy     History   Social History  . Marital Status: Married    Spouse Name: N/A    Number of Children: N/A  . Years of Education: N/A   Social History Main Topics  . Smoking status: Never Smoker   . Smokeless tobacco: None  . Alcohol Use: No  . Drug Use: No  . Sexual Activity: Yes   Other Topics Concern  . None   Social History Narrative  . None   Family History  Problem Relation Age of Onset  . Cancer Father   . Heart disease Maternal Grandfather   . Cancer Paternal Grandfather    Allergies  Allergen Reactions  . Codeine Nausea And Vomiting  . Morphine And Related Nausea And Vomiting  . Penicillins Rash  . Septra [Sulfamethoxazole-Trimethoprim] Rash   Prior to Admission medications   Medication Sig Start Date End Date Taking? Authorizing Provider  CHOLESTYRAMINE PO Take 9 g by mouth. 1 to 2 daily    Historical Provider, MD  clonazePAM (KLONOPIN) 0.5 MG tablet Take 1 tablet (0.5 mg total) by mouth 2 (two) times daily as needed for  anxiety. 02/07/13   Thao P Le, DO     ROS: The patient denies  chills, night sweats, unintentional weight loss, chest pain, palpitations, wheezing, dyspnea on exertion, dysuria, hematuria, melena, numbness, weakness, or tingling.   All other systems have been reviewed and were otherwise negative with the exception of those mentioned in the HPI and as above.    PHYSICAL EXAM: Filed Vitals:   02/20/13 1621  BP: 120/80  Pulse: 60  Temp: 98 F (36.7 C)  Resp: 16   Filed Vitals:   02/20/13 1621  Height: 5' (1.524 m)  Weight: 100 lb (45.36 kg)   Body mass index is 19.53 kg/(m^2).  General: Alert, no acute distress HEENT:  Normocephalic, atraumatic, oropharynx patent. EOMI, PERRLA Cardiovascular:  Regular rate and rhythm, no rubs murmurs or gallops.  No Carotid bruits, radial pulse intact. No pedal edema.  Respiratory: Clear to auscultation bilaterally.  No wheezes, rales, or rhonchi.  No cyanosis, no use of accessory musculature GI: No organomegaly, abdomen is soft and non-tender, positive bowel sounds.  No masses. Skin: No rashes. Neurologic: Facial musculature symmetric. Psychiatric: Patient is appropriate throughout our interaction. Lymphatic: No cervical lymphadenopathy Musculoskeletal: Gait intact.   LABS: Results  for orders placed in visit on 02/20/13  POCT CBC      Result Value Range   WBC 5.6  4.6 - 10.2 K/uL   Lymph, poc 1.5  0.6 - 3.4   POC LYMPH PERCENT 26.1  10 - 50 %L   MID (cbc) 0.3  0 - 0.9   POC MID % 5.3  0 - 12 %M   POC Granulocyte 3.8  2 - 6.9   Granulocyte percent 68.6  37 - 80 %G   RBC 4.88  4.04 - 5.48 M/uL   Hemoglobin 14.4  12.2 - 16.2 g/dL   HCT, POC 16.1  09.6 - 47.9 %   MCV 93.8  80 - 97 fL   MCH, POC 29.5  27 - 31.2 pg   MCHC 31.4 (*) 31.8 - 35.4 g/dL   RDW, POC 04.5     Platelet Count, POC 191  142 - 424 K/uL   MPV 10.6  0 - 99.8 fL     EKG/XRAY:   Primary read interpreted by Dr. Conley Rolls at [facility] No free air, no  obstruction   ASSESSMENT/PLAN: Encounter Diagnoses  Name Primary?  . Diarrhea Yes  . Fever   . Nausea alone   . Abdominal pain     I think her xrays show some movement of her stool She continues to have diarrhea and loose stools We will go ahead and put her back on the cholysteramine and see if lees frequent BM will help She will also take her klonopin 1/2 tab to help with sleep She is planning to travel to see her daughter on Wednesday so would like to feel better.  F/u prn, I will try to call her in 2 days.  Gross sideeffects, risk and benefits, and alternatives of medications d/w patient. Patient is aware that all medications have potential sideeffects and we are unable to predict every sideeffect or drug-drug interaction that may occur.  LE, THAO PHUONG, DO 02/20/2013 5:46 PM  02/22/13 LM to see how she is doing. Xrays are negative.  FINDINGS:  Surgical clips in the right upper abdomen. Bilateral pelvic  phleboliths. Small bowel and colon are not nondilated. No  pneumatosis identified. Horizontal beam radiograph would be required  to accurately assess for free air. Regional bones unremarkable.  IMPRESSION:  Nonobstructive bowel gas pattern.

## 2013-02-23 ENCOUNTER — Encounter: Payer: Self-pay | Admitting: Family Medicine

## 2013-10-01 DIAGNOSIS — R2232 Localized swelling, mass and lump, left upper limb: Secondary | ICD-10-CM

## 2013-10-01 HISTORY — DX: Localized swelling, mass and lump, left upper limb: R22.32

## 2013-10-19 ENCOUNTER — Ambulatory Visit (INDEPENDENT_AMBULATORY_CARE_PROVIDER_SITE_OTHER): Payer: BC Managed Care – PPO | Admitting: Family Medicine

## 2013-10-19 VITALS — BP 102/68 | HR 72 | Temp 98.5°F | Resp 18 | Ht 60.0 in | Wt 99.2 lb

## 2013-10-19 DIAGNOSIS — R509 Fever, unspecified: Secondary | ICD-10-CM

## 2013-10-19 DIAGNOSIS — R51 Headache: Secondary | ICD-10-CM

## 2013-10-19 DIAGNOSIS — L989 Disorder of the skin and subcutaneous tissue, unspecified: Secondary | ICD-10-CM

## 2013-10-19 DIAGNOSIS — Z8261 Family history of arthritis: Secondary | ICD-10-CM

## 2013-10-19 DIAGNOSIS — M25579 Pain in unspecified ankle and joints of unspecified foot: Secondary | ICD-10-CM

## 2013-10-19 DIAGNOSIS — T148 Other injury of unspecified body region: Secondary | ICD-10-CM

## 2013-10-19 DIAGNOSIS — W57XXXA Bitten or stung by nonvenomous insect and other nonvenomous arthropods, initial encounter: Secondary | ICD-10-CM

## 2013-10-19 LAB — POCT CBC
Granulocyte percent: 69.9 %G (ref 37–80)
HCT, POC: 42.4 % (ref 37.7–47.9)
Hemoglobin: 13.8 g/dL (ref 12.2–16.2)
Lymph, poc: 1.1 (ref 0.6–3.4)
MCH, POC: 29.5 pg (ref 27–31.2)
MCHC: 32.4 g/dL (ref 31.8–35.4)
MCV: 90.9 fL (ref 80–97)
MID (cbc): 0.4 (ref 0–0.9)
MPV: 8.6 fL (ref 0–99.8)
POC Granulocyte: 3.3 (ref 2–6.9)
POC LYMPH PERCENT: 22.4 %L (ref 10–50)
POC MID %: 7.7 % (ref 0–12)
Platelet Count, POC: 182 10*3/uL (ref 142–424)
RBC: 4.67 M/uL (ref 4.04–5.48)
RDW, POC: 14.7 %
WBC: 4.7 10*3/uL (ref 4.6–10.2)

## 2013-10-19 LAB — POCT SEDIMENTATION RATE: POCT SED RATE: 30 mm/hr — AB (ref 0–22)

## 2013-10-19 NOTE — Progress Notes (Signed)
 Chief Complaint:  Chief Complaint  Patient presents with  . Fever    102 since friday   . Generalized Body Aches  . Dizziness    HPI: Angela Ortega is a 63 y.o. female who is here for 1 week hisotry of fevers and worsening msk aches.  No recent travels, has had 2 tick bites in [place] and then 1 in November , she had a had a NP at her work removed.  She did not get medicines for either. She cleaned it off with sopa and water.  She does not remember. She has joitn aches, she had generalized msk aches. She has had HA.   She had Tmax of 102, she ahs been taking tylenol for HA without releif, she then took ibupfren and that really upsets her stomachm she got some releif from exc No rachses, no neck pain. She had some weakness in her joints.  No new travels outside of country.  She has had some weakness and msk aches about 1 month ago.   Past Medical History  Diagnosis Date  . Anemia    Past Surgical History  Procedure Laterality Date  . Cholecystectomy    . Abdominal hysterectomy     History   Social History  . Marital Status: Married    Spouse Name: N/A    Number of Children: N/A  . Years of Education: N/A   Social History Main Topics  . Smoking status: Never Smoker   . Smokeless tobacco: None  . Alcohol Use: No  . Drug Use: No  . Sexual Activity: Yes   Other Topics Concern  . None   Social History Narrative  . None   Family History  Problem Relation Age of Onset  . Cancer Father   . Heart disease Maternal Grandfather   . Cancer Paternal Grandfather    Allergies  Allergen Reactions  . Codeine Nausea And Vomiting  . Morphine And Related Nausea And Vomiting  . Penicillins Rash  . Septra [Sulfamethoxazole-Trimethoprim] Rash   Prior to Admission medications   Medication Sig Start Date End Date Taking? Authorizing Provider  CHOLESTYRAMINE PO Take 9 g by mouth. 1 to 2 daily   Yes Historical Provider, MD  clonazePAM (KLONOPIN) 0.5 MG tablet Take 1  tablet (0.5 mg total) by mouth 2 (two) times daily as needed for anxiety. 02/07/13    P , DO  hydrocortisone (ANUSOL-HC) 2.5 % rectal cream Place 1 application rectally 2 (two) times daily. 02/20/13    P , DO     ROS: The patient denies  chills, night sweats, unintentional weight loss, chest pain, palpitations, wheezing, dyspnea on exertion, nausea, vomiting, abdominal pain, dysuria, hematuria, melena, numbness, , or tingling.   All other systems have been reviewed and were otherwise negative with the exception of those mentioned in the HPI and as above.    PHYSICAL EXAM: Filed Vitals:   10/19/13 1842  BP: 102/68  Pulse: 72  Temp: 98.5 F (36.9 C)  Resp: 18   Filed Vitals:   10/19/13 1842  Height: 5' (1.524 m)  Weight: 99 lb 3.2 oz (44.997 kg)   Body mass index is 19.37 kg/(m^2).  General: Alert, no acute distress HEENT:  Normocephalic, atraumatic, oropharynx patent. EOMI, PERRLA Cardiovascular:  Regular rate and rhythm, no rubs murmurs or gallops.  No Carotid bruits, radial pulse intact. No pedal edema.  Respiratory: Clear to auscultation bilaterally.  No wheezes, rales, or rhonchi.  No cyanosis, no  use of accessory musculature GI: No organomegaly, abdomen is soft and non-tender, positive bowel sounds.  No masses. Skin: No rashes. Neurologic: Facial musculature symmetric. Psychiatric: Patient is appropriate throughout our interaction. Cn 2-12 grossly nl Lymphatic: No cervical lymphadenopathy Musculoskeletal: Gait intact. 5/5 strength, 2/2 DTRS, no meningeal signs ft ring  Finger-soft tissue tumor ? Fatty tissue vs ganglion cyst full ROM, strength and sensation   LABS: Results for orders placed in visit on 10/19/13  POCT CBC      Result Value Ref Range   WBC 4.7  4.6 - 10.2 K/uL   Lymph, poc 1.1  0.6 - 3.4   POC LYMPH PERCENT 22.4  10 - 50 %L   MID (cbc) 0.4  0 - 0.9   POC MID % 7.7  0 - 12 %M   POC Granulocyte 3.3  2 - 6.9   Granulocyte percent 69.9  37 -  80 %G   RBC 4.67  4.04 - 5.48 M/uL   Hemoglobin 13.8  12.2 - 16.2 g/dL   HCT, POC 42.4  37.7 - 47.9 %   MCV 90.9  80 - 97 fL   MCH, POC 29.5  27 - 31.2 pg   MCHC 32.4  31.8 - 35.4 g/dL   RDW, POC 14.7     Platelet Count, POC 182  142 - 424 K/uL   MPV 8.6  0 - 99.8 fL     EKG/XRAY:   Primary read interpreted by Dr. Marin Comment at [facility].   ASSESSMENT/PLAN: Encounter Diagnoses  Name Primary?  . Headache(784.0) Yes  . Tick bite   . Fever, unspecified   . Pain in joint, ankle and foot, unspecified laterality   . Family history of rheumatoid arthritis   . Hand lesion    Labs pending She has a hsitory of a very sensitive stomach and many meds make her have diarrhea, will wait for labs to see if she needs treatment for tickborne illnesses She is taking Excedrin and will cont with that and push fluids Refer to hand surgery since she would like to wear her wedding ring, ? Fatty tunor vs ganglion cyst Labs pending F/u by phone or prn  Gross sideeffects, risk and benefits, and alternatives of medications d/w patient. Patient is aware that all medications have potential sideeffects and we are unable to predict every sideeffect or drug-drug interaction that may occur.  , [place], DO 10/19/2013 7:38 PM

## 2013-10-20 LAB — COMPLETE METABOLIC PANEL WITHOUT GFR
ALT: 23 U/L (ref 0–35)
AST: 22 U/L (ref 0–37)
Creat: 1.08 mg/dL (ref 0.50–1.10)
Sodium: 140 meq/L (ref 135–145)
Total Bilirubin: 0.4 mg/dL (ref 0.2–1.2)

## 2013-10-20 LAB — COMPLETE METABOLIC PANEL WITH GFR
Albumin: 4.5 g/dL (ref 3.5–5.2)
Alkaline Phosphatase: 48 U/L (ref 39–117)
BUN: 26 mg/dL — ABNORMAL HIGH (ref 6–23)
CO2: 28 mEq/L (ref 19–32)
Calcium: 9.4 mg/dL (ref 8.4–10.5)
Chloride: 102 mEq/L (ref 96–112)
GFR, Est African American: 64 mL/min
GFR, Est Non African American: 56 mL/min — ABNORMAL LOW
Glucose, Bld: 94 mg/dL (ref 70–99)
Potassium: 4.1 mEq/L (ref 3.5–5.3)
Total Protein: 7.4 g/dL (ref 6.0–8.3)

## 2013-10-20 LAB — RHEUMATOID FACTOR: Rheumatoid fact SerPl-aCnc: <10 [IU]/mL (ref <=14)

## 2013-10-21 LAB — ROCKY MTN SPOTTED FVR ABS PNL(IGG+IGM)
RMSF IgG: 0.13 IV
RMSF IgM: 0.33 IV

## 2013-10-21 LAB — B. BURGDORFI ANTIBODIES: B burgdorferi Ab IgG+IgM: 0.38 {ISR}

## 2013-10-26 ENCOUNTER — Encounter: Payer: Self-pay | Admitting: Family Medicine

## 2013-10-26 DIAGNOSIS — W57XXXA Bitten or stung by nonvenomous insect and other nonvenomous arthropods, initial encounter: Secondary | ICD-10-CM

## 2013-10-27 MED ORDER — DOXYCYCLINE HYCLATE 100 MG PO TABS: 100.0000 mg | ORAL_TABLET | Freq: Two times a day (BID) | ORAL | Status: DC

## 2013-10-28 ENCOUNTER — Other Ambulatory Visit: Payer: Self-pay | Admitting: Orthopedic Surgery

## 2013-10-31 ENCOUNTER — Encounter (HOSPITAL_BASED_OUTPATIENT_CLINIC_OR_DEPARTMENT_OTHER): Payer: Self-pay | Admitting: *Deleted

## 2013-11-03 ENCOUNTER — Ambulatory Visit (HOSPITAL_BASED_OUTPATIENT_CLINIC_OR_DEPARTMENT_OTHER): Payer: BC Managed Care – PPO | Admitting: Certified Registered"

## 2013-11-03 ENCOUNTER — Ambulatory Visit (HOSPITAL_BASED_OUTPATIENT_CLINIC_OR_DEPARTMENT_OTHER)
Admission: RE | Admit: 2013-11-03 | Discharge: 2013-11-03 | Disposition: A | Discharge status: Home / Self Care | Payer: BC Managed Care – PPO | Source: Ambulatory Visit | Attending: Orthopedic Surgery | Admitting: Orthopedic Surgery

## 2013-11-03 ENCOUNTER — Encounter (HOSPITAL_BASED_OUTPATIENT_CLINIC_OR_DEPARTMENT_OTHER): Payer: BC Managed Care – PPO | Admitting: Certified Registered"

## 2013-11-03 ENCOUNTER — Encounter (HOSPITAL_BASED_OUTPATIENT_CLINIC_OR_DEPARTMENT_OTHER): Payer: Self-pay | Admitting: Certified Registered"

## 2013-11-03 ENCOUNTER — Encounter (HOSPITAL_BASED_OUTPATIENT_CLINIC_OR_DEPARTMENT_OTHER)
Admission: RE | Disposition: A | Discharge status: Home / Self Care | Payer: Self-pay | Source: Ambulatory Visit | Attending: Orthopedic Surgery

## 2013-11-03 DIAGNOSIS — R29898 Other symptoms and signs involving the musculoskeletal system: Secondary | ICD-10-CM | POA: Diagnosis present

## 2013-11-03 DIAGNOSIS — D2110 Benign neoplasm of connective and other soft tissue of unspecified upper limb, including shoulder: Secondary | ICD-10-CM | POA: Insufficient documentation

## 2013-11-03 HISTORY — DX: Localized swelling, mass and lump, left upper limb: R22.32

## 2013-11-03 HISTORY — DX: Noninfective gastroenteritis and colitis, unspecified: K52.9

## 2013-11-03 HISTORY — PX: MASS EXCISION: SHX2000

## 2013-11-03 LAB — POCT HEMOGLOBIN-HEMACUE: Hemoglobin: 13.4 g/dL (ref 12.0–15.0)

## 2013-11-03 SURGERY — EXCISION MASS
Anesthesia: Monitor Anesthesia Care | Site: Finger | Laterality: Left

## 2013-11-03 MED ORDER — MEPERIDINE HCL 25 MG/ML IJ SOLN: 6.2500 mg | INTRAMUSCULAR | Status: DC | PRN

## 2013-11-03 MED ORDER — PROPOFOL 10 MG/ML IV BOLUS
INTRAVENOUS | Status: DC | PRN
  Administered 2013-11-03: 15 mg via INTRAVENOUS

## 2013-11-03 MED ORDER — FENTANYL CITRATE 0.05 MG/ML IJ SOLN
INTRAMUSCULAR | Status: AC
  Filled 2013-11-03: qty 4

## 2013-11-03 MED ORDER — MIDAZOLAM HCL 2 MG/2ML IJ SOLN: 1.0000 mg | INTRAMUSCULAR | Status: DC | PRN

## 2013-11-03 MED ORDER — LIDOCAINE HCL (PF) 0.5 % IJ SOLN
INTRAMUSCULAR | Status: DC | PRN
  Administered 2013-11-03: 25 mL via INTRAVENOUS

## 2013-11-03 MED ORDER — PROMETHAZINE HCL 25 MG/ML IJ SOLN: 6.2500 mg | INTRAMUSCULAR | Status: DC | PRN

## 2013-11-03 MED ORDER — VANCOMYCIN HCL IN DEXTROSE 1-5 GM/200ML-% IV SOLN
1000.0000 mg | INTRAVENOUS | Status: AC
  Administered 2013-11-03: 1000 mg via INTRAVENOUS

## 2013-11-03 MED ORDER — LACTATED RINGERS IV SOLN
INTRAVENOUS | Status: DC
  Administered 2013-11-03: 10:00:00 via INTRAVENOUS

## 2013-11-03 MED ORDER — BUPIVACAINE HCL (PF) 0.25 % IJ SOLN
INTRAMUSCULAR | Status: AC
  Filled 2013-11-03: qty 30

## 2013-11-03 MED ORDER — MIDAZOLAM HCL 2 MG/ML PO SYRP: 12.0000 mg | ORAL_SOLUTION | Freq: Once | ORAL | Status: DC | PRN

## 2013-11-03 MED ORDER — VANCOMYCIN HCL IN DEXTROSE 1-5 GM/200ML-% IV SOLN
INTRAVENOUS | Status: AC
  Filled 2013-11-03: qty 200

## 2013-11-03 MED ORDER — CHLORHEXIDINE GLUCONATE 4 % EX LIQD: 60.0000 mL | Freq: Once | CUTANEOUS | Status: DC

## 2013-11-03 MED ORDER — HYDROCODONE-ACETAMINOPHEN 5-325 MG PO TABS: ORAL_TABLET | ORAL | Status: DC

## 2013-11-03 MED ORDER — FENTANYL CITRATE 0.05 MG/ML IJ SOLN
INTRAMUSCULAR | Status: DC | PRN
  Administered 2013-11-03: 50 ug via INTRAVENOUS

## 2013-11-03 MED ORDER — ONDANSETRON HCL 4 MG/2ML IJ SOLN
INTRAMUSCULAR | Status: DC | PRN
Start: 2013-11-03 — End: 2013-11-03
  Administered 2013-11-03: 3 mg via INTRAVENOUS

## 2013-11-03 MED ORDER — MIDAZOLAM HCL 5 MG/5ML IJ SOLN
INTRAMUSCULAR | Status: DC | PRN
  Administered 2013-11-03: 1 mg via INTRAVENOUS

## 2013-11-03 MED ORDER — PROPOFOL 10 MG/ML IV BOLUS
INTRAVENOUS | Status: AC
  Filled 2013-11-03: qty 40

## 2013-11-03 MED ORDER — OXYCODONE HCL 5 MG/5ML PO SOLN: 5.0000 mg | Freq: Once | ORAL | Status: DC | PRN

## 2013-11-03 MED ORDER — FENTANYL CITRATE 0.05 MG/ML IJ SOLN: 25.0000 ug | INTRAMUSCULAR | Status: DC | PRN

## 2013-11-03 MED ORDER — MIDAZOLAM HCL 2 MG/2ML IJ SOLN
INTRAMUSCULAR | Status: AC
  Filled 2013-11-03: qty 2

## 2013-11-03 MED ORDER — FENTANYL CITRATE 0.05 MG/ML IJ SOLN: 50.0000 ug | INTRAMUSCULAR | Status: DC | PRN

## 2013-11-03 MED ORDER — BUPIVACAINE HCL (PF) 0.25 % IJ SOLN
INTRAMUSCULAR | Status: DC | PRN
  Administered 2013-11-03: 2 mL

## 2013-11-03 MED ORDER — OXYCODONE HCL 5 MG PO TABS: 5.0000 mg | ORAL_TABLET | Freq: Once | ORAL | Status: DC | PRN

## 2013-11-03 SURGICAL SUPPLY — 53 items
APL SKNCLS STERI-STRIP NONHPOA (GAUZE/BANDAGES/DRESSINGS)
BANDAGE COBAN STERILE 2 (GAUZE/BANDAGES/DRESSINGS) IMPLANT
BANDAGE ELASTIC 3 VELCRO ST LF (GAUZE/BANDAGES/DRESSINGS) IMPLANT
BENZOIN TINCTURE PRP APPL 2/3 (GAUZE/BANDAGES/DRESSINGS) IMPLANT
BLADE MINI RND TIP GREEN BEAV (BLADE) IMPLANT
BLADE SURG 15 STRL LF DISP TIS (BLADE) ×2 IMPLANT
BLADE SURG 15 STRL SS (BLADE) ×4
BNDG CMPR 9X4 STRL LF SNTH (GAUZE/BANDAGES/DRESSINGS)
BNDG CMPR MD 5X2 ELC HKLP STRL (GAUZE/BANDAGES/DRESSINGS)
BNDG COHESIVE 1X5 TAN STRL LF (GAUZE/BANDAGES/DRESSINGS) IMPLANT
BNDG CONFORM 2 STRL LF (GAUZE/BANDAGES/DRESSINGS) IMPLANT
BNDG ELASTIC 2 VLCR STRL LF (GAUZE/BANDAGES/DRESSINGS) IMPLANT
BNDG ESMARK 4X9 LF (GAUZE/BANDAGES/DRESSINGS) IMPLANT
BNDG GAUZE 1X2.1 STRL (MISCELLANEOUS) IMPLANT
BNDG GAUZE ELAST 4 BULKY (GAUZE/BANDAGES/DRESSINGS) IMPLANT
BNDG PLASTER X FAST 3X3 WHT LF (CAST SUPPLIES) IMPLANT
BNDG PLSTR 9X3 FST ST WHT (CAST SUPPLIES)
CHLORAPREP W/TINT 26ML (MISCELLANEOUS) ×2 IMPLANT
CORDS BIPOLAR (ELECTRODE) ×2 IMPLANT
COVER MAYO STAND STRL (DRAPES) ×2 IMPLANT
COVER TABLE BACK 60X90 (DRAPES) ×2 IMPLANT
CUFF TOURNIQUET SINGLE 18IN (TOURNIQUET CUFF) ×2 IMPLANT
DRAPE EXTREMITY T 121X128X90 (DRAPE) ×2 IMPLANT
DRAPE SURG 17X23 STRL (DRAPES) ×2 IMPLANT
GAUZE SPONGE 4X4 12PLY STRL (GAUZE/BANDAGES/DRESSINGS) ×2 IMPLANT
GAUZE XEROFORM 1X8 LF (GAUZE/BANDAGES/DRESSINGS) ×2 IMPLANT
GLOVE BIO SURGEON STRL SZ7.5 (GLOVE) ×2 IMPLANT
GLOVE BIOGEL PI IND STRL 8 (GLOVE) ×1 IMPLANT
GLOVE BIOGEL PI INDICATOR 8 (GLOVE) ×1
GOWN STRL REUS W/ TWL LRG LVL3 (GOWN DISPOSABLE) ×1 IMPLANT
GOWN STRL REUS W/TWL LRG LVL3 (GOWN DISPOSABLE) ×2
GOWN STRL REUS W/TWL XL LVL3 (GOWN DISPOSABLE) ×2 IMPLANT
NDL HYPO 25X1 1.5 SAFETY (NEEDLE) ×1 IMPLANT
NEEDLE HYPO 25X1 1.5 SAFETY (NEEDLE) ×2 IMPLANT
NS IRRIG 1000ML POUR BTL (IV SOLUTION) ×2 IMPLANT
PACK BASIN DAY SURGERY FS (CUSTOM PROCEDURE TRAY) ×2 IMPLANT
PAD CAST 3X4 CTTN HI CHSV (CAST SUPPLIES) IMPLANT
PAD CAST 4YDX4 CTTN HI CHSV (CAST SUPPLIES) IMPLANT
PADDING CAST ABS 4INX4YD NS (CAST SUPPLIES) ×1
PADDING CAST ABS COTTON 4X4 ST (CAST SUPPLIES) ×1 IMPLANT
PADDING CAST COTTON 3X4 STRL (CAST SUPPLIES)
PADDING CAST COTTON 4X4 STRL (CAST SUPPLIES)
STOCKINETTE 4X48 STRL (DRAPES) ×2 IMPLANT
STRIP CLOSURE SKIN 1/2X4 (GAUZE/BANDAGES/DRESSINGS) IMPLANT
SUT ETHILON 3 0 PS 1 (SUTURE) IMPLANT
SUT ETHILON 4 0 PS 2 18 (SUTURE) ×2 IMPLANT
SUT ETHILON 5 0 P 3 18 (SUTURE)
SUT NYLON ETHILON 5-0 P-3 1X18 (SUTURE) IMPLANT
SUT VIC AB 4-0 P2 18 (SUTURE) IMPLANT
SYR BULB 3OZ (MISCELLANEOUS) ×2 IMPLANT
SYR CONTROL 10ML LL (SYRINGE) ×2 IMPLANT
TOWEL OR 17X24 6PK STRL BLUE (TOWEL DISPOSABLE) ×4 IMPLANT
UNDERPAD 30X30 INCONTINENT (UNDERPADS AND DIAPERS) ×2 IMPLANT

## 2013-11-03 NOTE — Brief Op Note (Signed)
11/03/2013  12:39 PM  PATIENT:  ZIVAH MAYR  63 y.o. female  PRE-OPERATIVE DIAGNOSIS:  left ring finger mass  POST-OPERATIVE DIAGNOSIS:  left ring finger mass  PROCEDURE:  Procedure(s): EXCISION MASS LEFT RING FINGER (Left)  SURGEON:  Surgeon(s) and Role:    * [name], MD - Primary  PHYSICIAN ASSISTANT:   ASSISTANTS: none   ANESTHESIA:   Bier block  EBL:  Total I/O In: 500 [I.V.:500] Out: -   BLOOD ADMINISTERED:none  DRAINS: none   LOCAL MEDICATIONS USED:  MARCAINE     SPECIMEN:  Source of Specimen:  left ring finger  DISPOSITION OF SPECIMEN:  PATHOLOGY  COUNTS:  YES  TOURNIQUET:   Total Tourniquet Time Documented: Forearm (Left) - 22 minutes Total: Forearm (Left) - 22 minutes   DICTATION: .Other Dictation: Dictation Number [id number]  PLAN OF CARE: Discharge to home after PACU  PATIENT DISPOSITION:  PACU - hemodynamically stable.

## 2013-11-03 NOTE — Discharge Instructions (Addendum)

## 2013-11-03 NOTE — Transfer of Care (Signed)
Immediate Anesthesia Transfer of Care Note  Patient: Angela Ortega  Procedure(s) Performed: Procedure(s): EXCISION MASS LEFT RING FINGER (Left)  Patient Location: PACU  Anesthesia Type:MAC and Bier block  Level of Consciousness: awake, alert  and oriented  Airway & Oxygen Therapy: Patient Spontanous Breathing  Post-op Assessment: Report given to PACU RN, Post -op Vital signs reviewed and stable and Patient moving all extremities  Post vital signs: Reviewed and stable  Complications: No apparent anesthesia complications

## 2013-11-03 NOTE — Anesthesia Postprocedure Evaluation (Signed)
  Anesthesia Post-op Note  Patient: Angela Ortega  Procedure(s) Performed: Procedure(s): EXCISION MASS LEFT RING FINGER (Left)  Patient Location: PACU  Anesthesia Type:MAC  Level of Consciousness: awake  Airway and Oxygen Therapy: Patient Spontanous Breathing  Post-op Pain: mild  Post-op Assessment: Post-op Vital signs reviewed  Post-op Vital Signs: Reviewed  Last Vitals:  Filed Vitals:   11/03/13 1330  BP: 107/61  Pulse: 61  Temp:   Resp: 19    Complications: No apparent anesthesia complications

## 2013-11-03 NOTE — Anesthesia Preprocedure Evaluation (Addendum)
Anesthesia Evaluation    Airway       Dental   Pulmonary neg pulmonary ROS,          Cardiovascular negative cardio ROS      Neuro/Psych    GI/Hepatic Neg liver ROS, Chronic diarrhea since cholecystectomy   Endo/Other  negative endocrine ROS  Renal/GU negative Renal ROS     Musculoskeletal   Abdominal   Peds  Hematology   Anesthesia Other Findings   Reproductive/Obstetrics                           Anesthesia Physical Anesthesia Plan  ASA: II  Anesthesia Plan:    Post-op Pain Management:    Induction:   Airway Management Planned:   Additional Equipment:   Intra-op Plan:   Post-operative Plan:   Informed Consent:   Plan Discussed with:   Anesthesia Plan Comments:         Anesthesia Quick Evaluation

## 2013-11-03 NOTE — Op Note (Signed)
[id number] 

## 2013-11-03 NOTE — H&P (Signed)
  Angela Ortega is an 63 y.o. female.   Chief Complaint: left ring finger mass HPI: 63 yo rhd female with cyst left ring finger x 2 years.  It has gotten larger in last 2 months.  It is bothersome to her.  She wishes to have it excised.  Past Medical History  Diagnosis Date  . Mass of finger of left hand 10/2013    ring finger  . Chronic diarrhea     since gallbladder surgery - takes Cholestyramine    Past Surgical History  Procedure Laterality Date  . Cholecystectomy    . Abdominal hysterectomy      partial  . Tonsillectomy and adenoidectomy  1966    Family History  Problem Relation Age of Onset  . Cancer Father   . Heart disease Maternal Grandfather   . Cancer Paternal Grandfather    Social History:  reports that she has never smoked. She has never used smokeless tobacco. She reports that she does not drink alcohol or use illicit drugs.  Allergies:  Allergies  Allergen Reactions  . Dilaudid [Hydromorphone Hcl] Other (See Comments)    RESPIRATORY DEPRESSION  . Codeine Nausea And Vomiting  . Milk-Related Compounds Diarrhea and Nausea And Vomiting  . Morphine And Related Nausea And Vomiting  . Penicillins Rash  . Septra [Sulfamethoxazole-Trimethoprim] Rash    No prescriptions prior to admission    No results found for this or any previous visit (from the past 48 hour(s)).  No results found.   A comprehensive review of systems was negative except for: Eyes: positive for contacts/glasses Hematologic/lymphatic: positive for easy bruising  Height 5' (1.524 m), weight 44.453 kg (98 lb).  General appearance: alert, cooperative and appears stated age Head: Normocephalic, without obvious abnormality, atraumatic Neck: supple, symmetrical, trachea midline Resp: clear to auscultation bilaterally Cardio: regular rate and rhythm GI: non tender Extremities: intact sensation and capillary refill all digits.  +epl/fpl/io.  mass on dorsum left ring finger.  no skin  changes,. Pulses: 2+ and symmetric Skin: Skin color, texture, turgor normal. No rashes or lesions Neurologic: Grossly normal Incision/Wound: none  Assessment/Plan Left ring finger mass.  Non operative and operative treatment options were discussed with the patient and patient wishes to proceed with operative treatment. She wishes to have it excised.  Risks, benefits, and alternatives of surgery were discussed and the patient agrees with the plan of care.   Chandra Asher R 11/03/2013, 9:23 AM

## 2013-11-03 NOTE — Op Note (Signed)
NAME:  [name], [name]               ACCOUNT NO.:  [id number]  MEDICAL RECORD NO.:  [id number]  LOCATION:                                 FACILITY:  PHYSICIAN:  [name], MD             DATE OF BIRTH:  DATE OF PROCEDURE:  11/03/2013 DATE OF DISCHARGE:                              OPERATIVE REPORT   PREOPERATIVE DIAGNOSIS:  Left ring finger mass.  POSTOPERATIVE DIAGNOSIS:  Left ring finger mass.  PROCEDURE:  Excision of mass, left ring finger, 1 cm.  SURGEON:  [name], MD  ASSISTANT:  None.  ANESTHESIA:  Bier block.  IV FLUIDS:  Per anesthesia flow sheet.  ESTIMATED BLOOD LOSS:  Minimal.  COMPLICATIONS:  None.  SPECIMENS:  None.  TOURNIQUET TIME:  22 minutes.  DISPOSITION:  Stable to PACU.  INDICATIONS:  Ms. Angela Ortega is a 63 year old female with a mass in her left ring finger.  She has had this for years.  It is bothersome to her. It slowly enlarged.  She wished to have it removed.  Risks, benefits, and alternatives of surgery were discussed including risk of blood loss, infection, damage to nerves, vessels, tendons, ligaments, bone; failure of surgery; need for additional surgery, complications with wound healing, continued pain, and recurrence of mass.  She voiced understanding of these risks and elected to proceed.  OPERATIVE COURSE:  After being identified preoperatively by myself, the patient and I agreed upon procedure and site of procedure.  Surgical site was marked.  The risks, benefits, and alternatives of surgery were reviewed and she wished to proceed.  Surgical consent had been signed. She was given IV vancomycin as preoperative antibiotic prophylaxis due to PENICILLIN ALLERGY.  She was transferred to the operating room and placed on the operating room table in supine position.  Left upper extremity on arm board.  Bier block anesthesia was induced by the anesthesiologist.  The left upper extremity was prepped and draped in normal sterile orthopedic  fashion.  Surgical pause performed between surgeons, anesthesia, and operating staff, and all were in agreement as to the patient, procedure, and site of procedure.  Tourniquet had been inflated for the Bier block.  Incision was made over the mass and carried into subcutaneous tissues by spreading technique.  The mass was easily identified and released from its surrounding soft tissue attachments.  It came out in its entirety.  The wound was inspected and no remainder of the mass was noted.  It was pearly white in appearance. It measured 1 cm.  It was sent to Pathology for examination.  The wound was copiously irrigated with sterile saline.  It was then closed with a running subcuticular 5-0 Monocryl suture which was augmented with Steri- Strips.  The area was injected with 2.5 mL of 0.25% plain Marcaine to aid in postoperative analgesia.  The wound was then dressed with sterile 4x4s and wrapped lightly with Coban dressing.  Tourniquet was deflated at 22 minutes.  The fingertips were pink with brisk capillary refill after deflation of tourniquet.  After drapes were broken down, the patient was awoken from anesthesia safely.  She was transferred back to stretcher  and taken to PACU in stable condition.  I will see her back in the office in 1 week for postoperative followup. We will give her Norco 5/325, 1 to 2 p.o. q.6 hours p.r.n. pain, dispensed #30.     [name], MD     KK/MEDQ  D:  11/03/2013  T:  11/03/2013  Job:  [id number]

## 2013-11-03 NOTE — Anesthesia Procedure Notes (Signed)
Procedure Name: MAC Date/Time: 11/03/2013 12:13 PM Performed by: Baxter Flattery Pre-anesthesia Checklist: Patient identified, Emergency Drugs available, Suction available and Patient being monitored Patient Re-evaluated:Patient Re-evaluated prior to inductionOxygen Delivery Method: Simple face mask Preoxygenation: Pre-oxygenation with 100% oxygen Dental Injury: Teeth and Oropharynx as per pre-operative assessment

## 2013-11-04 ENCOUNTER — Encounter (HOSPITAL_BASED_OUTPATIENT_CLINIC_OR_DEPARTMENT_OTHER): Payer: Self-pay | Admitting: Orthopedic Surgery

## 2013-11-08 LAB — EHRLICHIA ANTIBODY PANEL
E chaffeensis (HGE) Ab, IgG: <1:64 {titer}
E chaffeensis (HGE) Ab, IgM: <1:20 {titer}

## 2014-08-07 ENCOUNTER — Encounter: Payer: Self-pay | Admitting: *Deleted

## 2014-09-22 ENCOUNTER — Ambulatory Visit (INDEPENDENT_AMBULATORY_CARE_PROVIDER_SITE_OTHER): Payer: BLUE CROSS/BLUE SHIELD | Admitting: Family Medicine

## 2014-09-22 ENCOUNTER — Ambulatory Visit (INDEPENDENT_AMBULATORY_CARE_PROVIDER_SITE_OTHER): Payer: BLUE CROSS/BLUE SHIELD

## 2014-09-22 VITALS — BP 112/78 | HR 75 | Temp 98.1°F | Resp 18 | Ht 59.75 in | Wt 102.2 lb

## 2014-09-22 DIAGNOSIS — R1011 Right upper quadrant pain: Secondary | ICD-10-CM

## 2014-09-22 LAB — POCT CBC
Granulocyte percent: 83.4 %G — AB (ref 37–80)
HCT, POC: 41 % (ref 37.7–47.9)
Hemoglobin: 14 g/dL (ref 12.2–16.2)
Lymph, poc: 0.7 (ref 0.6–3.4)
MCH, POC: 29.5 pg (ref 27–31.2)
MCHC: 34.1 g/dL (ref 31.8–35.4)
MCV: 86.4 fL (ref 80–97)
MID (cbc): 0.4 (ref 0–0.9)
MPV: 8.5 fL (ref 0–99.8)
POC Granulocyte: 5.4 (ref 2–6.9)
POC LYMPH PERCENT: 10.4 %L (ref 10–50)
POC MID %: 6.2 %M (ref 0–12)
Platelet Count, POC: 170 10*3/uL (ref 142–424)
RBC: 4.75 M/uL (ref 4.04–5.48)
RDW, POC: 13.8 %
WBC: 6.5 10*3/uL (ref 4.6–10.2)

## 2014-09-22 LAB — POCT URINALYSIS DIPSTICK
Bilirubin, UA: NEGATIVE
Glucose, UA: NEGATIVE
Ketones, UA: NEGATIVE
Nitrite, UA: NEGATIVE
Protein, UA: NEGATIVE
Spec Grav, UA: 1.02
Urobilinogen, UA: 0.2
pH, UA: 5.5

## 2014-09-22 LAB — POCT UA - MICROSCOPIC ONLY
Bacteria, U Microscopic: NEGATIVE
Casts, Ur, LPF, POC: NEGATIVE
Crystals, Ur, HPF, POC: NEGATIVE
Mucus, UA: NEGATIVE
RBC, urine, microscopic: NEGATIVE
Yeast, UA: NEGATIVE

## 2014-09-22 NOTE — Progress Notes (Addendum)
This chart was scribed for Robyn Haber, MD by Moises Blood, medical scribe at [facility].The patient was seen in exam room 2 and the patient's care was started at 10:52 AM.  Patient ID: Angela Ortega MRN: [id number], DOB: Oct 29, 1950, 64 y.o. Date of Encounter: 09/22/2014  Primary Physician: Leotis Pain, DO  Chief Complaint:  Chief Complaint  Patient presents with   Abdominal Pain    On right side, nausea. Pain feels like it is spreading towards navel.     HPI:  Angela Ortega is a 64 y.o. female who presents to [facility] complaining of steady abdominal pain with associated nausea on his right side around 5:00 AM this morning.  She has tried changing positions but to no relief. She feels like the pain is radiating down to her navel. She describes this pain similar to when she had gallbladder issues; however, she has had her gallbladder removed. She had 3 BM's this morning and they were soft and formed stools. She denies fever, diarrhea, vomiting and dysuria. She denies any sick contact at home.   Her last colonoscopy was 2 years ago, done by Dr. Collene Mares.   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  She is married.   Past Medical History  Diagnosis Date   Mass of finger of left hand 10/2013    ring finger   Chronic diarrhea     since gallbladder surgery - takes Cholestyramine     Home Meds: Prior to Admission medications   Medication Sig Start Date End Date Taking? Authorizing Provider  CHOLESTYRAMINE PO Take 1 packet by mouth every morning. 1 to 2 daily   Yes Historical Provider, MD  HYDROcodone-acetaminophen [facility]) 5-325 MG per tablet 1-2 tabs po q6 hours prn pain Patient not taking: Reported on 09/22/2014 11/03/13   [name], MD    Allergies:  Allergies  Allergen Reactions   Dilaudid [Hydromorphone Hcl] Other (See Comments)    RESPIRATORY DEPRESSION   Codeine Nausea And Vomiting   Milk-Related Compounds Diarrhea and  Nausea And Vomiting   Morphine And Related Nausea And Vomiting   Penicillins Rash   Septra [Sulfamethoxazole-Trimethoprim] Rash    History   Social History   Marital Status: Married    Spouse Name: N/A   Number of Children: N/A   Years of Education: N/A   Occupational History   Not on file.   Social History Main Topics   Smoking status: Never Smoker    Smokeless tobacco: Never Used   Alcohol Use: No   Drug Use: No   Sexual Activity: Yes   Other Topics Concern   Not on file   Social History Narrative     Review of Systems: Constitutional: negative for chills, fever, night sweats, weight changes, or fatigue  HEENT: negative for vision changes, hearing loss, congestion, rhinorrhea, ST, epistaxis, or sinus pressure Cardiovascular: negative for chest pain or palpitations Respiratory: negative for hemoptysis, wheezing, shortness of breath, or cough Abdominal: negative for vomiting, diarrhea, or constipation; positive for nausea, abdominal pain (RLQ) Dermatological: negative for rash Neurologic: negative for headache, dizziness, or syncope GI: negative for dysuria, negative for UTI symptoms All other systems reviewed and are otherwise negative with the exception to those above and in the HPI.  Physical Exam: Blood pressure 112/78, pulse 75, temperature 98.1 F (36.7 C), temperature source Oral, resp. rate 18, height 4' 11.75" (1.518 m), weight 102 lb 3.2 oz (46.358 kg), SpO2 98 %.,  Body mass index is 20.12 kg/(m^2). General: Well developed, well nourished, in no acute distress. Head: Normocephalic, atraumatic, eyes without discharge, sclera non-icteric, nares are without discharge. Bilateral auditory canals clear, TM's are without perforation, pearly grey and translucent with reflective cone of light bilaterally. Oral cavity moist, posterior pharynx without exudate, erythema, peritonsillar abscess, or post nasal drip.  Neck: Supple. No thyromegaly. Full ROM. No  lymphadenopathy. Lungs: Clear bilaterally to auscultation without wheezes, rales, or rhonchi. Breathing is unlabored. Heart: RRR with S1 S2. No murmurs, rubs, or gallops appreciated. Abdomen: Soft,  non-distended with normoactive bowel sounds. . No obvious abdominal masses. There is some mild tenderness in the midepigastrium, and off to the left of the umbilicus as well. No hepatosplenomegaly. No rebound or guarding Msk:  Strength and tone normal for age. Extremities/Skin: Warm and dry. No clubbing or cyanosis. No edema. No rashes or suspicious lesions. Neuro: Alert and oriented X 3. Moves all extremities spontaneously. Gait is normal. CNII-XII grossly in tact. Psych:  Responds to questions appropriately with a normal affect.   Labs: Results for orders placed or performed in visit on 09/22/14  POCT CBC  Result Value Ref Range   WBC 6.5 4.6 - 10.2 K/uL   Lymph, poc 0.7 0.6 - 3.4   POC LYMPH PERCENT 10.4 10 - 50 %L   MID (cbc) 0.4 0 - 0.9   POC MID % 6.2 0 - 12 %M   POC Granulocyte 5.4 2 - 6.9   Granulocyte percent 83.4 (A) 37 - 80 %G   RBC 4.75 4.04 - 5.48 M/uL   Hemoglobin 14.0 12.2 - 16.2 g/dL   HCT, POC 41.0 37.7 - 47.9 %   MCV 86.4 80 - 97 fL   MCH, POC 29.5 27 - 31.2 pg   MCHC 34.1 31.8 - 35.4 g/dL   RDW, POC 13.8 %   Platelet Count, POC 170 142 - 424 K/uL   MPV 8.5 0 - 99.8 fL  POCT urinalysis dipstick  Result Value Ref Range   Color, UA yellow    Clarity, UA clear    Glucose, UA neg    Bilirubin, UA neg    Ketones, UA neg    Spec Grav, UA 1.020    Blood, UA tr-lysed    pH, UA 5.5    Protein, UA neg    Urobilinogen, UA 0.2    Nitrite, UA neg    Leukocytes, UA moderate (2+) (A) Negative  POCT UA - Microscopic Only  Result Value Ref Range   WBC, Ur, HPF, POC 6-9    RBC, urine, microscopic neg    Bacteria, U Microscopic neg    Mucus, UA neg    Epithelial cells, urine per micros 0-4    Crystals, Ur, HPF, POC neg    Casts, Ur, LPF, POC neg    Yeast, UA neg   [facility]  reading (PRIMARY) by  Dr. Joseph Art:  Acute abd series-->.few dilated loops of large intestine, no acute changes    ASSESSMENT AND PLAN:  64 y.o. year old female with constant right upper quadrant pain which is mild in severity. With a normal CBC and urinalysis combined with some dilated loops of bowel, somewhat certain that patient has another constipation problem. She agrees to take MiraLAX every couple hours and return if pain worsens or persists This chart was scribed in my presence and reviewed by me personally.    ICD-9-CM ICD-10-CM   1. RUQ abdominal pain 789.01 R10.11 POCT CBC  POCT urinalysis dipstick     POCT UA - Microscopic Only     DG Abd Acute W/Chest     Signed, Robyn Haber, MD 09/22/2014 11:59 AM

## 2014-09-22 NOTE — Patient Instructions (Signed)
The x-rays show mild partial obstruction in the left descending colon. I would go ahead and start the MiraLAX again and take it every couple hours until you've successfully emptied the left colon. If pain persists or worsens, please return

## 2015-02-14 ENCOUNTER — Ambulatory Visit (INDEPENDENT_AMBULATORY_CARE_PROVIDER_SITE_OTHER): Payer: BLUE CROSS/BLUE SHIELD

## 2015-02-14 ENCOUNTER — Ambulatory Visit (INDEPENDENT_AMBULATORY_CARE_PROVIDER_SITE_OTHER): Payer: BLUE CROSS/BLUE SHIELD | Admitting: Emergency Medicine

## 2015-02-14 VITALS — BP 108/70 | HR 85 | Temp 98.3°F | Resp 16 | Ht 59.0 in | Wt 103.4 lb

## 2015-02-14 DIAGNOSIS — R1031 Right lower quadrant pain: Secondary | ICD-10-CM

## 2015-02-14 DIAGNOSIS — G8929 Other chronic pain: Secondary | ICD-10-CM

## 2015-02-14 LAB — POCT CBC
Granulocyte percent: 84.7 %G — AB (ref 37–80)
HCT, POC: 41.5 % (ref 37.7–47.9)
HEMOGLOBIN: 14.3 g/dL (ref 12.2–16.2)
Lymph, poc: 0.8 (ref 0.6–3.4)
MCH, POC: 30 pg (ref 27–31.2)
MCHC: 34.5 g/dL (ref 31.8–35.4)
MCV: 86.9 fL (ref 80–97)
MID (cbc): 0.4 (ref 0–0.9)
MPV: 8.5 fL (ref 0–99.8)
POC GRANULOCYTE: 6.4 (ref 2–6.9)
POC LYMPH %: 10.5 % (ref 10–50)
POC MID %: 4.8 %M (ref 0–12)
Platelet Count, POC: 154 10*3/uL (ref 142–424)
RBC: 4.78 M/uL (ref 4.04–5.48)
RDW, POC: 13.5 %
WBC: 7.6 10*3/uL (ref 4.6–10.2)

## 2015-02-14 LAB — POCT URINALYSIS DIP (MANUAL ENTRY)
Bilirubin, UA: NEGATIVE
Glucose, UA: NEGATIVE
Nitrite, UA: NEGATIVE
PROTEIN UA: NEGATIVE
Spec Grav, UA: 1.025
Urobilinogen, UA: 0.2
pH, UA: 5.5

## 2015-02-14 NOTE — Patient Instructions (Signed)

## 2015-02-14 NOTE — Progress Notes (Signed)
Subjective:  Patient ID: Angela Ortega, female    DOB: [birth year]  Age: 64 y.o. MRN: [id number]  CC: Abdominal Pain; Back Pain; Irregular Heart Beat; and Dizziness   HPI Angela Ortega presents  with abdominal pain. She has a history of irritable bowel syndrome. She has been going to the bathroom about once a week. She has had poor appetite and nausea over the last several days. She's not moved her bowels since before the weekend. She has no blood in her stools or black stools no vomiting. No fever or chills. She has right lower quadrant pain. She has no cough wheezing or shortness of breath.  History Angela Ortega has a past medical history of Mass of finger of left hand (10/2013) and Chronic diarrhea.   She has past surgical history that includes Cholecystectomy; Abdominal hysterectomy; Tonsillectomy and adenoidectomy (1966); and Mass excision (Left, 11/03/2013).   Her  family history includes Cancer in her father and paternal grandfather; Heart disease in her maternal grandfather.  She   reports that she has never smoked. She has never used smokeless tobacco. She reports that she does not drink alcohol or use illicit drugs.  Outpatient Prescriptions Prior to Visit  Medication Sig Dispense Refill  . CHOLESTYRAMINE PO Take 1 packet by mouth every morning. 1 to 2 daily     No facility-administered medications prior to visit.    Social History   Social History  . Marital Status: Married    Spouse Name: N/A  . Number of Children: N/A  . Years of Education: N/A   Social History Main Topics  . Smoking status: Never Smoker   . Smokeless tobacco: Never Used  . Alcohol Use: No  . Drug Use: No  . Sexual Activity: Yes   Other Topics Concern  . None   Social History Narrative     Review of Systems  Constitutional: Positive for chills and appetite change. Negative for fever.  HENT: Negative for congestion, ear pain, postnasal drip, sinus pressure and sore throat.   Eyes: Negative  for pain and redness.  Respiratory: Negative for cough, shortness of breath and wheezing.   Cardiovascular: Positive for palpitations. Negative for leg swelling.  Gastrointestinal: Positive for nausea and abdominal pain. Negative for vomiting, diarrhea, constipation and blood in stool.  Endocrine: Negative for polyuria.  Genitourinary: Negative for dysuria, urgency, frequency and flank pain.  Musculoskeletal: Negative for gait problem.  Skin: Negative for rash.  Neurological: Negative for weakness and headaches.  Psychiatric/Behavioral: Negative for confusion and decreased concentration. The patient is not nervous/anxious.     Objective:  BP 108/70 mmHg  Pulse 85  Temp(Src) 98.3 F (36.8 C) (Oral)  Resp 16  Ht 4\' 11"  (1.499 m)  Wt 103 lb 6.4 oz (46.902 kg)  BMI 20.87 kg/m2  SpO2 96%  Physical Exam  Constitutional: She is oriented to person, place, and time. She appears well-developed and well-nourished.  HENT:  Head: Normocephalic and atraumatic.  Eyes: Conjunctivae are normal. Pupils are equal, round, and reactive to light.  Pulmonary/Chest: Effort normal.  Abdominal: Soft. Normal appearance. Bowel sounds are increased. There is no tenderness.  Musculoskeletal: She exhibits no edema.  Neurological: She is alert and oriented to person, place, and time.  Skin: Skin is dry.  Psychiatric: She has a normal mood and affect. Her behavior is normal. Thought content normal.      Assessment & Plan:   Angela Ortega was seen today for abdominal pain, back pain, irregular heart beat and  dizziness.  Diagnoses and all orders for this visit:  Abdominal pain, chronic, right lower quadrant -     POCT CBC -     POCT urinalysis dipstick -     POCT Microscopic Urinalysis ([facility]) -     DG Abd Acute W/Chest  I am having [name] maintain her CHOLESTYRAMINE PO.  No orders of the defined types were placed in this encounter.    Appropriate red flag conditions were discussed with the patient  as well as actions that should be taken.  Patient expressed his understanding.  Follow-up: Return if symptoms worsen or fail to improve.  Roselee Culver, MD   Results for orders placed or performed in visit on 02/14/15  POCT CBC  Result Value Ref Range   WBC 7.6 4.6 - 10.2 K/uL   Lymph, poc 0.8 0.6 - 3.4   POC LYMPH PERCENT 10.5 10 - 50 %L   MID (cbc) 0.4 0 - 0.9   POC MID % 4.8 0 - 12 %M   POC Granulocyte 6.4 2 - 6.9   Granulocyte percent 84.7 (A) 37 - 80 %G   RBC 4.78 4.04 - 5.48 M/uL   Hemoglobin 14.3 12.2 - 16.2 g/dL   HCT, POC 41.5 37.7 - 47.9 %   MCV 86.9 80 - 97 fL   MCH, POC 30.0 27 - 31.2 pg   MCHC 34.5 31.8 - 35.4 g/dL   RDW, POC 13.5 %   Platelet Count, POC 154 142 - 424 K/uL   MPV 8.5 0 - 99.8 fL  POCT urinalysis dipstick  Result Value Ref Range   Color, UA yellow yellow   Clarity, UA clear clear   Glucose, UA negative negative   Bilirubin, UA negative negative   Ketones, POC UA small (15) (A) negative   Spec Grav, UA 1.025    Blood, UA trace-lysed (A) negative   pH, UA 5.5    Protein Ur, POC negative negative   Urobilinogen, UA 0.2    Nitrite, UA Negative Negative   Leukocytes, UA Trace (A) Negative     [facility] reading (PRIMARY) by  Dr. Ouida Sills.  Increased stool burden.  No obstruction or free air.

## 2017-10-19 ENCOUNTER — Encounter: Payer: Self-pay | Admitting: Family Medicine

## 2017-10-19 ENCOUNTER — Ambulatory Visit (INDEPENDENT_AMBULATORY_CARE_PROVIDER_SITE_OTHER): Payer: Medicare PPO | Admitting: Family Medicine

## 2017-10-19 ENCOUNTER — Ambulatory Visit (INDEPENDENT_AMBULATORY_CARE_PROVIDER_SITE_OTHER): Payer: Medicare PPO

## 2017-10-19 VITALS — BP 115/66 | HR 79 | Temp 99.0°F | Ht 59.0 in | Wt 89.2 lb

## 2017-10-19 DIAGNOSIS — M25571 Pain in right ankle and joints of right foot: Secondary | ICD-10-CM

## 2017-10-19 DIAGNOSIS — M79601 Pain in right arm: Secondary | ICD-10-CM

## 2017-10-19 DIAGNOSIS — M25521 Pain in right elbow: Secondary | ICD-10-CM

## 2017-10-19 DIAGNOSIS — R200 Anesthesia of skin: Secondary | ICD-10-CM | POA: Diagnosis not present

## 2017-10-19 DIAGNOSIS — M25511 Pain in right shoulder: Secondary | ICD-10-CM

## 2017-10-19 DIAGNOSIS — M545 Low back pain, unspecified: Secondary | ICD-10-CM

## 2017-10-19 DIAGNOSIS — M542 Cervicalgia: Secondary | ICD-10-CM

## 2017-10-19 MED ORDER — CYCLOBENZAPRINE HCL 5 MG PO TABS: ORAL_TABLET | ORAL | 0 refills | Status: DC
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
## 2017-10-19 NOTE — Patient Instructions (Addendum)
There were no signs of fracture or broken bones of the elbow or shoulder x-rays.  Neck x-ray did not indicate any broken bones, but there were some signs of arthritis/disc degeneration.    Ankle, back and leg symptoms may be due to soreness from pressing on the pedal.  I do not see a reason for xray at this time, but if not improving with tylenol in the next week, return for recheck.  Return to the clinic or go to the nearest emergency room if any of your symptoms worsen or new symptoms occur.  Based on your symptoms I suspect some muscle spasm may cause some pinching of the nerves from the neck to the arms.  If you have any continued numbness, weakness, or any worsening symptoms, proceed to the emergency room to discuss further imaging.  For now okay to use Tylenol over-the-counter as needed, heat or ice to the affected areas, and muscle relaxant up to every 8 hours (but start that at bedtime as it does cause sedation).  Careful with risk of falling on that medication.  Thank you for coming in today.  Return to the clinic or go to the nearest emergency room if any of your symptoms worsen or new symptoms occur.   Cervical Radiculopathy Cervical radiculopathy means that a nerve in the neck is pinched or bruised. This can cause pain or loss of feeling (numbness) that runs from your neck to your arm and fingers. Follow these instructions at home: Managing pain  Take over-the-counter and prescription medicines only as told by your doctor.  If directed, put ice on the injured or painful area. ? Put ice in a plastic bag. ? Place a towel between your skin and the bag. ? Leave the ice on for 20 minutes, 2-3 times per day.  If ice does not help, you can try using heat. Take a warm shower or warm bath, or use a heat pack as told by your doctor.  You may try a gentle neck and shoulder massage. Activity  Rest as needed. Follow instructions from your doctor about any activities to avoid.  Do  exercises as told by your doctor or physical therapist. General instructions  If you were given a soft collar, wear it as told by your doctor.  Use a flat pillow when you sleep.  Keep all follow-up visits as told by your doctor. This is important. Contact a doctor if:  Your condition does not improve with treatment. Get help right away if:  Your pain gets worse and is not controlled with medicine.  You lose feeling or feel weak in your hand, arm, face, or leg.  You have a fever.  You have a stiff neck.  You cannot control when you poop or pee (have incontinence).  You have trouble with walking, balance, or talking. This information is not intended to replace advice given to you by your health care provider. Make sure you discuss any questions you have with your health care provider. Document Released: 02/06/2011 Document Revised: 07/26/2015 Document Reviewed: 04/13/2014 Elsevier Interactive Patient Education  [employer].   If you have lab work done today you will be contacted with your lab results within the next 2 weeks.  If you have not heard from [place] then please contact us. The fastest way to get your results is to register for My Chart.   IF you received an x-ray today, you will receive an invoice from [facility]. Please contact [facility] at [phone]  with questions or concerns regarding your invoice.   IF you received labwork today, you will receive an invoice from [place]. Please contact [facility] at [phone] with questions or concerns regarding your invoice.   Our billing staff will not be able to assist you with questions regarding bills from these companies.  You will be contacted with the lab results as soon as they are available. The fastest way to get your results is to activate your My Chart account. Instructions are located on the last page of this paperwork. If you have not heard from [place] regarding the results in 2 weeks, please  contact this office.

## 2017-12-07 DIAGNOSIS — D72819 Decreased white blood cell count, unspecified: Secondary | ICD-10-CM | POA: Diagnosis not present

## 2018-02-04 DIAGNOSIS — R634 Abnormal weight loss: Secondary | ICD-10-CM | POA: Diagnosis not present

## 2018-02-04 DIAGNOSIS — R197 Diarrhea, unspecified: Secondary | ICD-10-CM | POA: Diagnosis not present

## 2018-02-04 DIAGNOSIS — Z1211 Encounter for screening for malignant neoplasm of colon: Secondary | ICD-10-CM | POA: Diagnosis not present

## 2018-07-07 DIAGNOSIS — R197 Diarrhea, unspecified: Secondary | ICD-10-CM | POA: Diagnosis not present

## 2018-07-07 DIAGNOSIS — R634 Abnormal weight loss: Secondary | ICD-10-CM | POA: Diagnosis not present

## 2018-07-16 ENCOUNTER — Other Ambulatory Visit: Payer: Self-pay

## 2018-07-16 ENCOUNTER — Ambulatory Visit
Admission: EM | Admit: 2018-07-16 | Discharge: 2018-07-16 | Disposition: A | Discharge status: Home / Self Care | Payer: Medicare PPO

## 2018-07-16 ENCOUNTER — Ambulatory Visit (INDEPENDENT_AMBULATORY_CARE_PROVIDER_SITE_OTHER): Payer: Medicare PPO

## 2018-07-16 ENCOUNTER — Encounter: Payer: Self-pay | Admitting: Emergency Medicine

## 2018-07-16 DIAGNOSIS — M25522 Pain in left elbow: Secondary | ICD-10-CM | POA: Diagnosis not present

## 2018-07-16 MED ORDER — MELOXICAM 7.5 MG PO TABS: 7.5000 mg | ORAL_TABLET | Freq: Every day | ORAL | 0 refills | Status: DC

## 2018-07-16 MED ORDER — DICLOFENAC SODIUM 1 % TD GEL: 2.0000 g | Freq: Four times a day (QID) | TRANSDERMAL | 0 refills | Status: DC

## 2018-07-16 NOTE — ED Triage Notes (Signed)
Pt presents to [facility] after hitting her elbow on the door jam Wednesday night.  States she had a lot of pain yesterday, worsening.  Pt states she has tried ibuprofen and elevation, but when she got home from work last night she couldn't get her rings off due to swelling.

## 2018-07-16 NOTE — ED Provider Notes (Signed)
[facility]    CSN: [id number] Arrival date & time: 07/16/18  1007     History   Chief Complaint Chief Complaint  Patient presents with  . Arm Pain    HPI Angela Ortega is a 68 y.o. female.   68 year old female comes in for 3 day history of left elbow pain after injury. States she hit her elbow on the door. Denies falling. She has had increased pain, decreased ROM and swelling since then. Denies radiation of pain, numbness/tingling. Pain relieved with elbow flexed at 90 degrees, worsens with extension. She states ice compress and elevation without significant relief. States had swelling to the fingers after working last night. Denies strenuous activity at work.      Past Medical History:  Diagnosis Date  . Chronic diarrhea    since gallbladder surgery - takes Cholestyramine  . Mass of finger of left hand 10/2013   ring finger    There are no active problems to display for this patient.   Past Surgical History:  Procedure Laterality Date  . ABDOMINAL HYSTERECTOMY     partial  . CHOLECYSTECTOMY    . MASS EXCISION Left 11/03/2013   Procedure: EXCISION MASS LEFT RING FINGER;  Surgeon: [name], MD;  Location: [place];  Service: Orthopedics;  Laterality: Left;  . TONSILLECTOMY AND ADENOIDECTOMY  1966    OB History   No obstetric history on file.      Home Medications    Prior to Admission medications   Medication Sig Start Date End Date Taking? Authorizing Provider  cholestyramine (QUESTRAN) 4 g packet Take 4 g by mouth 3 (three) times daily with meals.   Yes [provider]  CREON 36000 units CPEP capsule TAKE 2 CAPULES AFTER EACH MEAL AND 1 CAPSULE AFTER A SNACK 10/08/17  Yes [provider]  clonazePAM (KLONOPIN) 0.5 MG tablet TAKE 1/2 TO 1 TABLET BY MOUTH TWICE A DAY AS NEEDED ANXIETY/INSOMNIA 09/04/17   [provider]  cyclobenzaprine (FLEXERIL) 5 MG tablet 1 pill by mouth up to every 8 hours as needed.  Start with one pill by mouth each bedtime as needed due to sedation 10/19/17   Wendie Agreste, MD  diclofenac sodium (VOLTAREN) 1 % GEL Apply 2 g topically 4 (four) times daily. 07/16/18   Tasia Catchings,  V, PA-C  meloxicam (MOBIC) 7.5 MG tablet Take 1 tablet (7.5 mg total) by mouth daily. 07/16/18   Ok Edwards, PA-C    Family History Family History  Problem Relation Age of Onset  . Cancer Father   . Heart disease Maternal Grandfather   . Cancer Paternal Grandfather     Social History Social History   Tobacco Use  . Smoking status: Never Smoker  . Smokeless tobacco: Never Used  Substance Use Topics  . Alcohol use: No  . Drug use: No     Allergies   Dilaudid [hydromorphone hcl]; Codeine; Milk-related compounds; Morphine and related; Penicillins; and Septra [sulfamethoxazole-trimethoprim]   Review of Systems Review of Systems  Reason unable to perform ROS: See HPI as above.     Physical Exam Triage Vital Signs ED Triage Vitals  Enc Vitals Group     BP 07/16/18 1020 126/83     Pulse Rate 07/16/18 1020 84     Resp 07/16/18 1020 16     Temp 07/16/18 1020 98.3 F (36.8 C)     Temp Source 07/16/18 1020 Oral     SpO2 07/16/18  1020 97 %     Weight --      Height --      Head Circumference --      Peak Flow --      Pain Score 07/16/18 1018 9     Pain Loc --      Pain Edu? --      Excl. in [place]? --    No data found.  Updated Vital Signs BP 126/83 (BP Location: Right Arm)   Pulse 84   Temp 98.3 F (36.8 C) (Oral)   Resp 16   SpO2 97%   Physical Exam Constitutional:      General: She is not in acute distress.    Appearance: She is well-developed. She is not diaphoretic.  HENT:     Head: Normocephalic and atraumatic.  Eyes:     Conjunctiva/sclera: Conjunctivae normal.     Pupils: Pupils are equal, round, and reactive to light.  Musculoskeletal:     Comments: Swelling to the left lateral epicondyle region. No erythema, warmth, contusion. Point tenderness to the  lateral epicondyle. Cannot fully extend elbow. Strength decreased due to pain, including grip strength, about 3/5. States grip strength causes elbow pain. Sensation intact. Radial pulse 2+, cap refill <2s.   Neurological:     Mental Status: She is alert and oriented to person, place, and time.    UC Treatments / Results  Labs (all labs ordered are listed, but only abnormal results are displayed) Labs Reviewed - No data to display  EKG None  Radiology Dg Elbow Complete Left  Result Date: 07/16/2018 CLINICAL DATA:  Injury with pain and swelling EXAM: LEFT ELBOW - COMPLETE 3+ VIEW COMPARISON:  None. FINDINGS: There is no evidence of fracture, dislocation, or joint effusion. There is no evidence of arthropathy or other focal bone abnormality. Soft tissues are unremarkable. IMPRESSION: Negative. Electronically Signed   By: Franchot Gallo M.D.   On: 07/16/2018 10:52    Procedures Procedures (including critical care time)  Medications Ordered in UC Medications - No data to display  Initial Impression / Assessment and Plan / UC Course  I have reviewed the triage vital signs and the nursing notes.  Pertinent labs & imaging results that were available during my care of the patient were reviewed by me and considered in my medical decision making (see chart for details).    Discussed mechanism of action is lower suspicion for fracture. However, patient has had swelling, painful ROM since injury, will obtain xray for further evaluation.   Xray negative. Patient with stomach upset with ibuprofen 200mg . Will provide voltaren gel at this time. If needed, can fill mobic. Ice compress. Return precautions given. Patient expresses understanding and agrees to plan.  Final Clinical Impressions(s) / UC Diagnoses   Final diagnoses:  Left elbow pain    ED Prescriptions    Medication Sig Dispense Auth. Provider   diclofenac sodium (VOLTAREN) 1 % GEL Apply 2 g topically 4 (four) times daily. 1 Tube  ,  V, PA-C   meloxicam (MOBIC) 7.5 MG tablet Take 1 tablet (7.5 mg total) by mouth daily. 10 tablet Tobin Chad, [place] 07/16/18 [phone]

## 2018-07-16 NOTE — Discharge Instructions (Signed)
Xray negative for fracture or dislocation. Start voltaren gel as directed with ice compress. If needing further relief, can fill mobic but take with food. Follow up with orthopedics if symptoms not improving.

## 2018-07-16 NOTE — ED Notes (Signed)
Patient able to ambulate independently  

## 2018-07-30 DIAGNOSIS — Z03818 Encounter for observation for suspected exposure to other biological agents ruled out: Secondary | ICD-10-CM | POA: Diagnosis not present

## 2018-09-23 DIAGNOSIS — Z803 Family history of malignant neoplasm of breast: Secondary | ICD-10-CM | POA: Diagnosis not present

## 2018-09-23 DIAGNOSIS — Z1231 Encounter for screening mammogram for malignant neoplasm of breast: Secondary | ICD-10-CM | POA: Diagnosis not present

## 2018-10-30 ENCOUNTER — Encounter: Payer: Self-pay | Admitting: Emergency Medicine

## 2018-10-30 ENCOUNTER — Ambulatory Visit
Admission: EM | Admit: 2018-10-30 | Discharge: 2018-10-30 | Disposition: A | Discharge status: Home / Self Care | Payer: Medicare PPO | Attending: Emergency Medicine | Admitting: Emergency Medicine

## 2018-10-30 ENCOUNTER — Other Ambulatory Visit: Payer: Self-pay

## 2018-10-30 DIAGNOSIS — H01004 Unspecified blepharitis left upper eyelid: Secondary | ICD-10-CM

## 2018-10-30 DIAGNOSIS — H00014 Hordeolum externum left upper eyelid: Secondary | ICD-10-CM | POA: Diagnosis not present

## 2018-10-30 MED ORDER — ERYTHROMYCIN 5 MG/GM OP OINT: TOPICAL_OINTMENT | OPHTHALMIC | 0 refills | Status: DC

## 2018-10-30 NOTE — ED Provider Notes (Signed)
[facility]    CSN: [id number] Arrival date & time: 10/30/18  [id number]      History   Chief Complaint Chief Complaint  Patient presents with  . Eye Pain    HPI Angela Ortega is a 68 y.o. female presenting for left upper eyelid swelling, irritation since yesterday.  Patient denies fever, change in vision, purulent discharge, painful EOM.  Patient denies foreign body exposure, sensation, photophobia.  States the only thing that has improved her discomfort is hot compress.  Patient denies wearing mascara, lash extensions, contacts, glasses.     Past Medical History:  Diagnosis Date  . Chronic diarrhea    since gallbladder surgery - takes Cholestyramine  . Mass of finger of left hand 10/2013   ring finger    There are no active problems to display for this patient.   Past Surgical History:  Procedure Laterality Date  . ABDOMINAL HYSTERECTOMY     partial  . CHOLECYSTECTOMY    . MASS EXCISION Left 11/03/2013   Procedure: EXCISION MASS LEFT RING FINGER;  Surgeon: [name], MD;  Location: [facility];  Service: Orthopedics;  Laterality: Left;  . TONSILLECTOMY AND ADENOIDECTOMY  1966    OB History   No obstetric history on file.      Home Medications    Prior to Admission medications   Medication Sig Start Date End Date Taking? Authorizing Provider  cholestyramine (QUESTRAN) 4 g packet Take 4 g by mouth 3 (three) times daily with meals.    [provider]  clonazePAM (KLONOPIN) 0.5 MG tablet TAKE 1/2 TO 1 TABLET BY MOUTH TWICE A DAY AS NEEDED ANXIETY/INSOMNIA 09/04/17   [provider]  CREON 36000 units CPEP capsule TAKE 2 CAPULES AFTER EACH MEAL AND 1 CAPSULE AFTER A SNACK 10/08/17   [provider]  cyclobenzaprine (FLEXERIL) 5 MG tablet 1 pill by mouth up to every 8 hours as needed. Start with one pill by mouth each bedtime as needed due to sedation 10/19/17   Wendie Agreste, MD  diclofenac sodium (VOLTAREN) 1 % GEL  Apply 2 g topically 4 (four) times daily. 07/16/18   Tasia Catchings, Amy V, PA-C  erythromycin ophthalmic ointment Place a 1/2 inch ribbon of ointment into the lower eyelid. 10/30/18   Hall-Potvin, [place], PA-C  meloxicam (MOBIC) 7.5 MG tablet Take 1 tablet (7.5 mg total) by mouth daily. 07/16/18   Ok Edwards, PA-C    Family History Family History  Problem Relation Age of Onset  . Cancer Father   . Heart disease Maternal Grandfather   . Cancer Paternal Grandfather     Social History Social History   Tobacco Use  . Smoking status: Never Smoker  . Smokeless tobacco: Never Used  Substance Use Topics  . Alcohol use: No  . Drug use: No     Allergies   Dilaudid [hydromorphone hcl], Codeine, Milk-related compounds, Morphine and related, Penicillins, and Septra [sulfamethoxazole-trimethoprim]   Review of Systems Review of Systems  Constitutional: Negative for fatigue and fever.  HENT: Negative for ear pain, sinus pain, sore throat and voice change.   Eyes: Positive for pain and discharge. Negative for photophobia, redness and visual disturbance.  Respiratory: Negative for cough and shortness of breath.   Cardiovascular: Negative for chest pain and palpitations.  Gastrointestinal: Negative for abdominal pain, diarrhea and vomiting.  Musculoskeletal: Negative for arthralgias and myalgias.  Skin: Negative for rash and wound.  Neurological: Negative for syncope and headaches.  Physical Exam Triage Vital Signs ED Triage Vitals  Enc Vitals Group     BP 10/30/18 0853 127/86     Pulse Rate 10/30/18 0853 68     Resp 10/30/18 0853 16     Temp 10/30/18 0853 98 F (36.7 C)     Temp Source 10/30/18 0853 Oral     SpO2 10/30/18 0853 98 %     Weight --      Height --      Head Circumference --      Peak Flow --      Pain Score 10/30/18 0857 8     Pain Loc --      Pain Edu? --      Excl. in [place]? --    No data found.  Updated Vital Signs BP 127/86 (BP Location: Right Arm)   Pulse 68    Temp 98 F (36.7 C) (Oral)   Resp 16   SpO2 98%   Visual Acuity Right Eye Distance:   Left Eye Distance:   Bilateral Distance:    Right Eye Near:   Left Eye Near:    Bilateral Near:     Physical Exam Constitutional:      General: She is not in acute distress. HENT:     Head: Normocephalic and atraumatic.     Mouth/Throat:     Mouth: Mucous membranes are moist.     Pharynx: Oropharynx is clear.  Eyes:     General: No scleral icterus.    Pupils: Pupils are equal, round, and reactive to light.     Comments: Left eye upper eyelid with moderate edema, trace erythema, no warmth.  Nontender to palpation, no foreign body identified with lid eversion.  Mild conjunctival injection, edema.  EOMs nontender.  Watery discharge in left eye, trace mucoid discharge on lateral aspect of upper lid.  Cardiovascular:     Rate and Rhythm: Normal rate.  Pulmonary:     Effort: Pulmonary effort is normal.  Skin:    Coloration: Skin is not jaundiced or pale.  Neurological:     Mental Status: She is alert and oriented to person, place, and time.      UC Treatments / Results  Labs (all labs ordered are listed, but only abnormal results are displayed) Labs Reviewed - No data to display  EKG   Radiology No results found.  Procedures Procedures (including critical care time)  Medications Ordered in UC Medications - No data to display  Initial Impression / Assessment and Plan / UC Course  I have reviewed the triage vital signs and the nursing notes.  Pertinent labs & imaging results that were available during my care of the patient were reviewed by me and considered in my medical decision making (see chart for details).     1.  Blepharitis of left upper eyelid We will increase frequency of hot compress use, add topical erythromycin.  Return precautions discussed, patient verbalized understanding and is agreeable to plan. Final Clinical Impressions(s) / UC Diagnoses   Final  diagnoses:  Blepharitis of left upper eyelid, unspecified type     Discharge Instructions     Use hot compresses as discussed. Be sure to wash hands prior to applying antibiotic ointment. Return for worsening pain, swelling, heat, fever, change in vision    ED Prescriptions    Medication Sig Dispense Auth. Provider   erythromycin ophthalmic ointment Place a 1/2 inch ribbon of ointment into the lower eyelid. 3.5 g Hall-Potvin, [place],  PA-C     Controlled Substance Prescriptions [place] Controlled Substance Registry consulted? Not Applicable   Quincy Sheehan, [place] 10/30/18 1104

## 2018-10-30 NOTE — Discharge Instructions (Addendum)
Use hot compresses as discussed. Be sure to wash hands prior to applying antibiotic ointment. Return for worsening pain, swelling, heat, fever, change in vision

## 2018-10-30 NOTE — ED Triage Notes (Addendum)
Per pt she woke up Thursday with eye irritation and pain on her left eye lid. Pt has swelling on her eye lid and itching and redness. No blurred vision. Has been using warm compresses with relief.

## 2018-11-04 DIAGNOSIS — K8681 Exocrine pancreatic insufficiency: Secondary | ICD-10-CM | POA: Diagnosis not present

## 2018-11-04 DIAGNOSIS — R634 Abnormal weight loss: Secondary | ICD-10-CM | POA: Diagnosis not present

## 2018-11-04 DIAGNOSIS — R197 Diarrhea, unspecified: Secondary | ICD-10-CM | POA: Diagnosis not present

## 2019-03-28 ENCOUNTER — Telehealth: Payer: Self-pay | Admitting: *Deleted

## 2019-03-28 NOTE — Telephone Encounter (Signed)
Schedule AWV.  

## 2019-04-12 ENCOUNTER — Ambulatory Visit: Payer: Medicare PPO

## 2019-04-12 DIAGNOSIS — Z1211 Encounter for screening for malignant neoplasm of colon: Secondary | ICD-10-CM | POA: Diagnosis not present

## 2019-04-12 DIAGNOSIS — R197 Diarrhea, unspecified: Secondary | ICD-10-CM | POA: Diagnosis not present

## 2019-04-12 DIAGNOSIS — R634 Abnormal weight loss: Secondary | ICD-10-CM | POA: Diagnosis not present

## 2019-04-14 ENCOUNTER — Ambulatory Visit: Payer: Medicare PPO

## 2019-05-10 ENCOUNTER — Telehealth: Payer: Self-pay | Admitting: *Deleted

## 2019-05-10 NOTE — Telephone Encounter (Signed)
Schedule AWV.  

## 2019-10-10 DIAGNOSIS — Z1231 Encounter for screening mammogram for malignant neoplasm of breast: Secondary | ICD-10-CM | POA: Diagnosis not present

## 2019-10-10 LAB — HM MAMMOGRAPHY

## 2020-04-06 ENCOUNTER — Ambulatory Visit
Admission: RE | Admit: 2020-04-06 | Discharge: 2020-04-06 | Disposition: A | Discharge status: Home / Self Care | Payer: Medicare PPO | Source: Ambulatory Visit | Attending: Emergency Medicine | Admitting: Emergency Medicine

## 2020-04-06 ENCOUNTER — Other Ambulatory Visit: Payer: Self-pay

## 2020-04-06 VITALS — BP 135/83 | HR 69 | Temp 98.2°F | Resp 18

## 2020-04-06 DIAGNOSIS — M654 Radial styloid tenosynovitis [de Quervain]: Secondary | ICD-10-CM

## 2020-04-06 MED ORDER — PREDNISONE 10 MG PO TABS
20.0000 mg | ORAL_TABLET | Freq: Every day | ORAL | 0 refills | Status: AC
Start: 2020-04-06 — End: 2020-04-13

## 2020-04-06 NOTE — ED Provider Notes (Signed)
[facility]    CSN: [id number] Arrival date & time: 04/06/20  1052      History   Chief Complaint Chief Complaint  Patient presents with  . Wrist Pain    X 1 week    HPI Angela Ortega is a 70 y.o. female presenting today for evaluation of right wrist pain.  Patient reports that approximately 1 week ago she began to develop soreness in her right wrist.  Denies any injury or fall.  Denies history of problems with wrist in the past.  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  Pain seemingly worsens with movement of her thumb and radiates into wrist/forearm.  Does report some occasional paresthesias into this second and third fingers.  Reports upset stomach with Aleve, using Tylenol for pain.  HPI  Past Medical History:  Diagnosis Date  . Chronic diarrhea    since gallbladder surgery - takes Cholestyramine  . Mass of finger of left hand 10/2013   ring finger    There are no problems to display for this patient.   Past Surgical History:  Procedure Laterality Date  . ABDOMINAL HYSTERECTOMY     partial  . CHOLECYSTECTOMY    . MASS EXCISION Left 11/03/2013   Procedure: EXCISION MASS LEFT RING FINGER;  Surgeon: [name], MD;  Location: [facility];  Service: Orthopedics;  Laterality: Left;  . TONSILLECTOMY AND ADENOIDECTOMY  1966    OB History   No obstetric history on file.      Home Medications    Prior to Admission medications   Medication Sig Start Date End Date Taking? Authorizing Provider  CREON 36000 units CPEP capsule TAKE 2 CAPULES AFTER EACH MEAL AND 1 CAPSULE AFTER A SNACK 10/08/17  Yes [provider]  predniSONE (DELTASONE) 10 MG tablet Take 2 tablets (20 mg total) by mouth daily with breakfast for 7 days. 04/06/20 04/13/20 Yes Aalyah Mansouri C, PA-C  diclofenac sodium (VOLTAREN) 1 % GEL Apply 2 g topically 4 (four) times daily. 07/16/18   Tasia Catchings, Amy V, PA-C  erythromycin ophthalmic ointment Place a  1/2 inch ribbon of ointment into the lower eyelid. 10/30/18   Hall-Potvin, [place], PA-C  meloxicam (MOBIC) 7.5 MG tablet Take 1 tablet (7.5 mg total) by mouth daily. 07/16/18   Ok Edwards, PA-C    Family History Family History  Problem Relation Age of Onset  . Cancer Father   . Heart disease Maternal Grandfather   . Cancer Paternal Grandfather     Social History Social History   Tobacco Use  . Smoking status: Never Smoker  . Smokeless tobacco: Never Used  Vaping Use  . Vaping Use: Never used  Substance Use Topics  . Alcohol use: No  . Drug use: No     Allergies   Dilaudid [hydromorphone hcl], Codeine, Milk-related compounds, Morphine and related, Penicillins, and Septra [sulfamethoxazole-trimethoprim]   Review of Systems Review of Systems  Constitutional: Negative for fatigue and fever.  Eyes: Negative for visual disturbance.  Respiratory: Negative for shortness of breath.   Cardiovascular: Negative for chest pain.  Gastrointestinal: Negative for abdominal pain, nausea and vomiting.  Musculoskeletal: Positive for arthralgias. Negative for joint swelling.  Skin: Negative for color change, rash and wound.  Neurological: Negative for dizziness, weakness, light-headedness and headaches.     Physical Exam Triage Vital Signs ED Triage Vitals  Enc Vitals Group     BP 04/06/20 1124 135/83  Pulse Rate 04/06/20 1124 69     Resp 04/06/20 1124 18     Temp 04/06/20 1124 98.2 F (36.8 C)     Temp Source 04/06/20 1124 Oral     SpO2 04/06/20 1124 97 %     Weight --      Height --      Head Circumference --      Peak Flow --      Pain Score 04/06/20 1126 8     Pain Loc --      Pain Edu? --      Excl. in [place]? --    No data found.  Updated Vital Signs BP 135/83 (BP Location: Left Arm)   Pulse 69   Temp 98.2 F (36.8 C) (Oral)   Resp 18   SpO2 97%   Visual Acuity Right Eye Distance:   Left Eye Distance:   Bilateral Distance:    Right Eye Near:   Left Eye  Near:    Bilateral Near:     Physical Exam Vitals and nursing note reviewed.  Constitutional:      Appearance: She is well-developed and well-nourished.     Comments: No acute distress  HENT:     Head: Normocephalic and atraumatic.     Nose: Nose normal.  Eyes:     Conjunctiva/sclera: Conjunctivae normal.  Cardiovascular:     Rate and Rhythm: Normal rate.  Pulmonary:     Effort: Pulmonary effort is normal. No respiratory distress.  Abdominal:     General: There is no distension.  Musculoskeletal:        General: Normal range of motion.     Cervical back: Neck supple.     Comments: No obvious swelling deformity or discoloration to right wrist/hand, full active range of motion of all 5 fingers, no significant tenderness to palpation along first through fifth metacarpals and first through fifth fingers, mild tenderness to palpation near distal radius, radial pulse 2+, sensation intact distally, positive Finkelstein's  Skin:    General: Skin is warm and dry.  Neurological:     Mental Status: She is alert and oriented to person, place, and time.  Psychiatric:        Mood and Affect: Mood and affect normal.      UC Treatments / Results  Labs (all labs ordered are listed, but only abnormal results are displayed) Labs Reviewed - No data to display  EKG   Radiology No results found.  Procedures Procedures (including critical care time)  Medications Ordered in UC Medications - No data to display  Initial Impression / Assessment and Plan / UC Course  I have reviewed the triage vital signs and the nursing notes.  Pertinent labs & imaging results that were available during my care of the patient were reviewed by me and considered in my medical decision making (see chart for details).     Exam suggestive of de Quervain's tenosynovitis, possible mild carpal tunnel as well, placing in thumb spica, prednisone course x1 week and icing.  Monitor for gradual resolution of  symptoms with limitation of triggering movements.  Discussed strict return precautions. Patient verbalized understanding and is agreeable with plan.  Final Clinical Impressions(s) / UC Diagnoses   Final diagnoses:  De Quervain's tenosynovitis, right     Discharge Instructions     Please wear wrist brace over the next 1 to 2 weeks Prednisone course 30 mg daily x1 week, if too harsh on her stomach, may stick with  Tylenol Follow-up if not improving or worsening    ED Prescriptions    Medication Sig Dispense Auth. Provider   predniSONE (DELTASONE) 10 MG tablet Take 2 tablets (20 mg total) by mouth daily with breakfast for 7 days. 14 tablet Marylynn Rigdon, [place] C, PA-C     PDMP not reviewed this encounter.   Janith Lima, [place] 04/06/20 1202

## 2020-04-06 NOTE — ED Triage Notes (Signed)
Patient complains of right wrist pain x 1 week worsening but denies any injury. Pt states pain subsided for a day or two but returned. Pt is aox4 and ambulatory.

## 2020-04-06 NOTE — Discharge Instructions (Addendum)
Please wear wrist brace over the next 1 to 2 weeks Prednisone course 30 mg daily x1 week, if too harsh on her stomach, may stick with Tylenol Follow-up if not improving or worsening

## 2020-05-10 DIAGNOSIS — M654 Radial styloid tenosynovitis [de Quervain]: Secondary | ICD-10-CM | POA: Diagnosis not present

## 2020-06-12 DIAGNOSIS — M654 Radial styloid tenosynovitis [de Quervain]: Secondary | ICD-10-CM | POA: Diagnosis not present

## 2020-10-16 LAB — HM MAMMOGRAPHY

## 2020-11-01 HISTORY — PX: WRIST FLEXION TENDON TENOTOMIES AND PROXIMAL CORPECTOMY W/ WRIST ARTHRODESIS&ILIAC CREST BONE GRAFT: SHX2672

## 2021-03-05 DIAGNOSIS — M79645 Pain in left finger(s): Secondary | ICD-10-CM | POA: Diagnosis not present

## 2021-03-05 DIAGNOSIS — M79642 Pain in left hand: Secondary | ICD-10-CM | POA: Diagnosis not present

## 2021-03-10 IMAGING — DX LEFT ELBOW - COMPLETE 3+ VIEW
4 series · 4 of 4 positions shown · non-contrast
Comparison: None.

CLINICAL DATA: Injury with pain and swelling

EXAM:
LEFT ELBOW - COMPLETE 3+ VIEW

[elbow ap (1 of 3)]
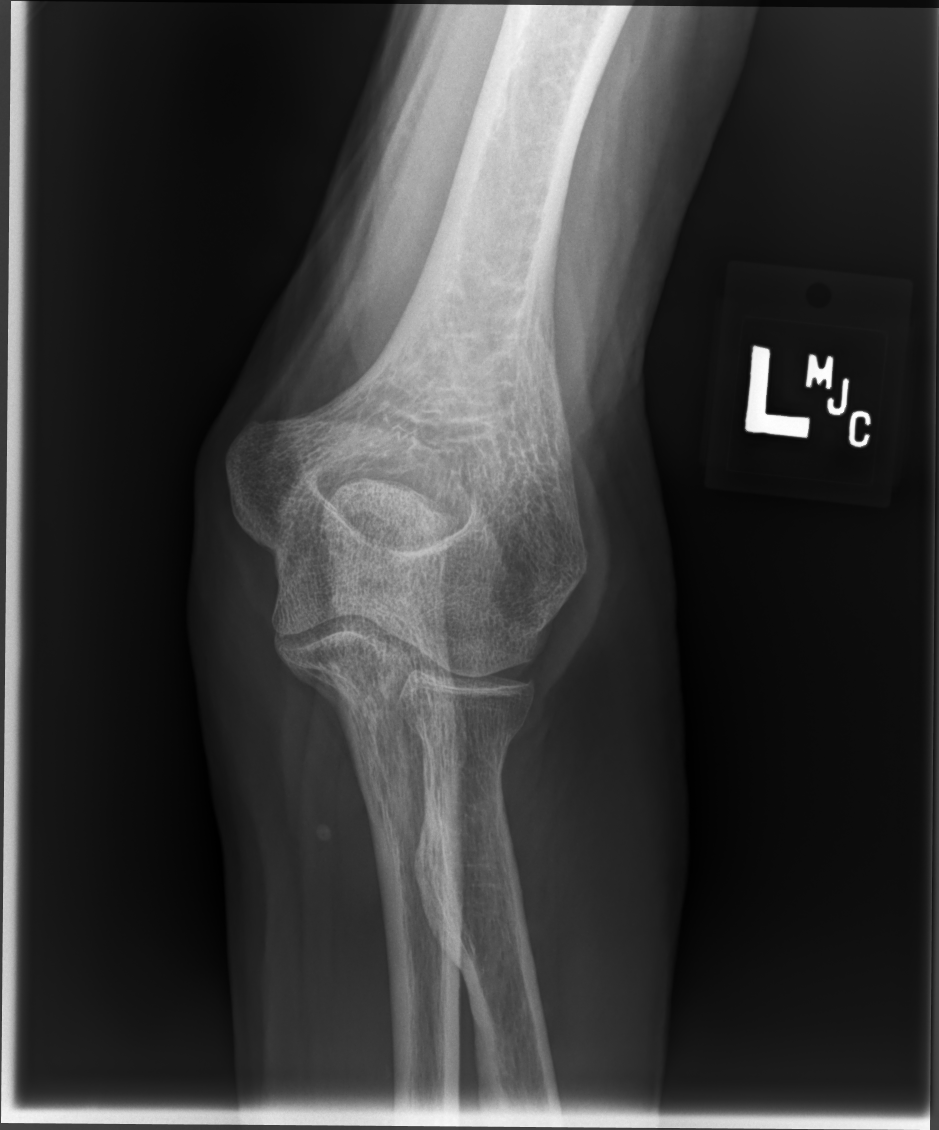

[elbow ap (2 of 3)]
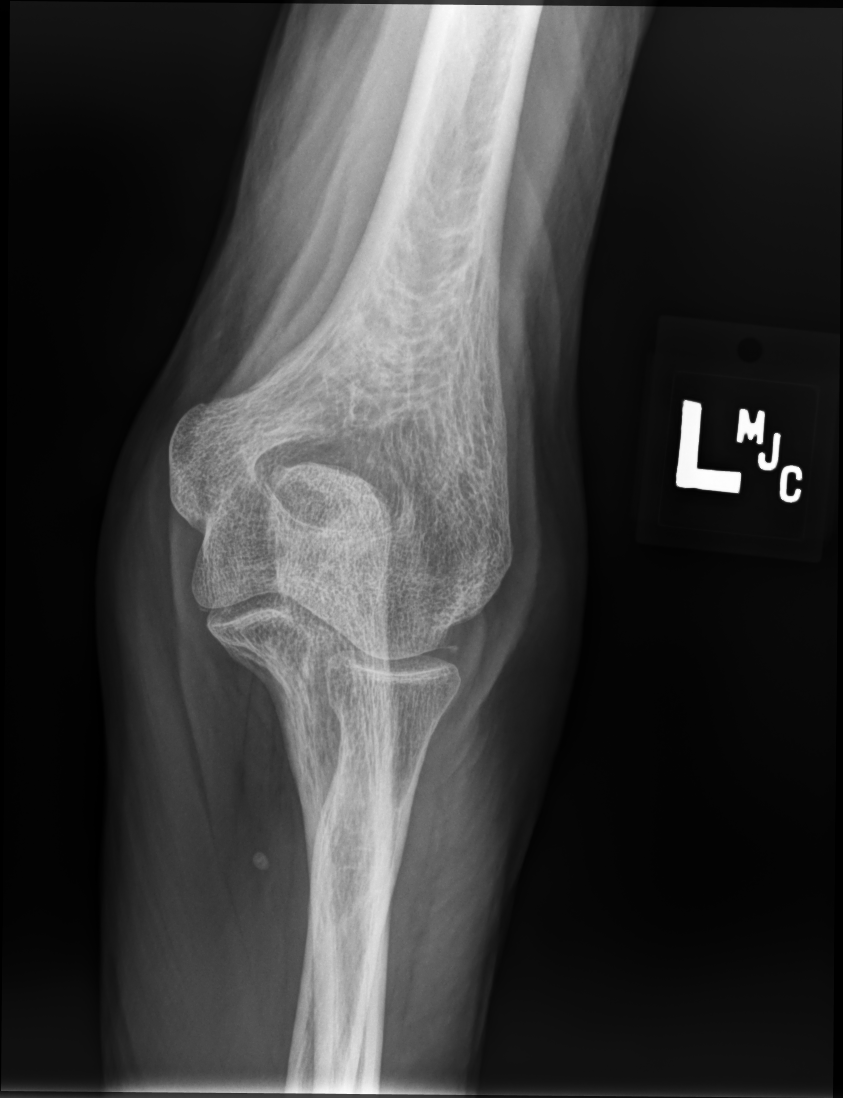

[elbow ap (3 of 3)]
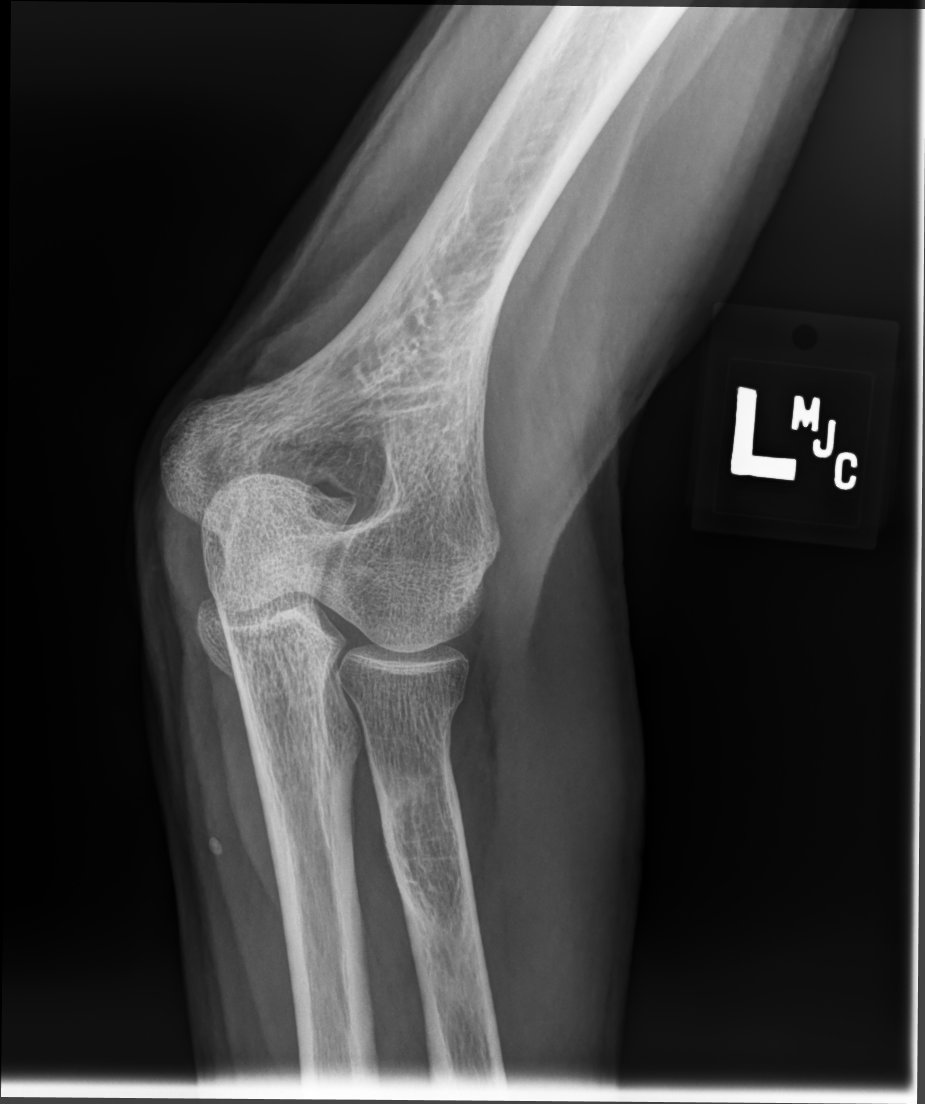

[elbow lat]
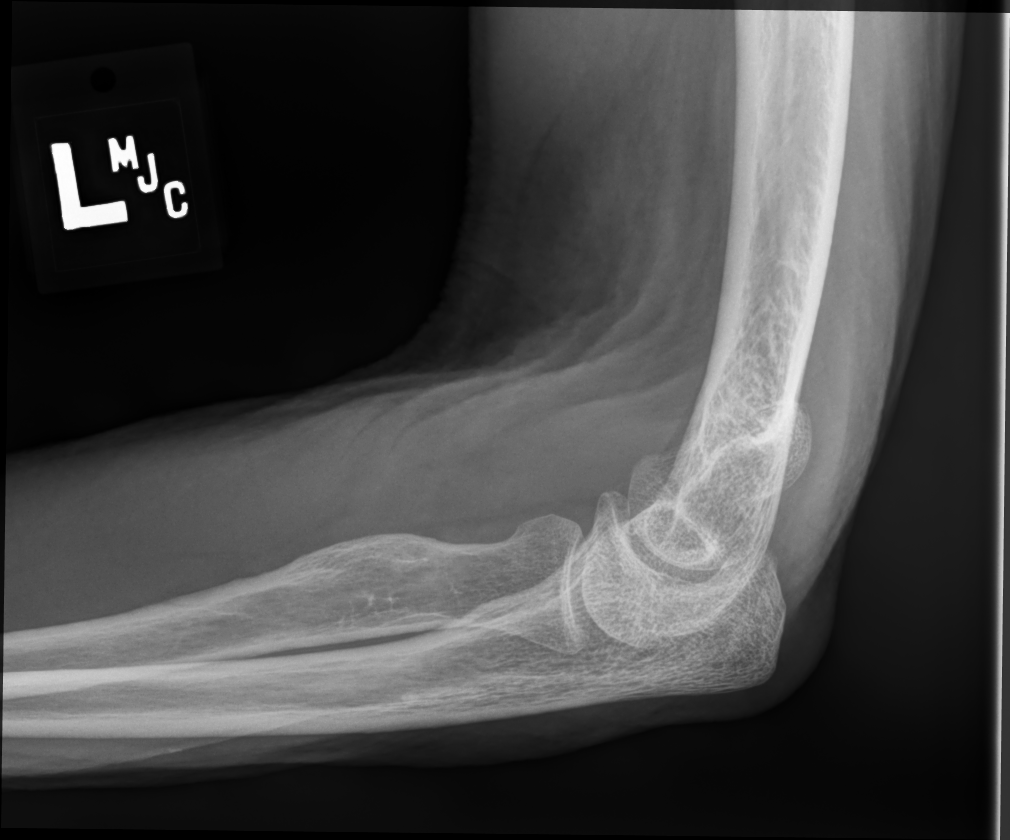

[4 of 4 positions shown; findings below may reference images not displayed]

FINDINGS: There is no evidence of fracture, dislocation, or joint effusion.
There is no evidence of arthropathy or other focal bone abnormality.
Soft tissues are unremarkable.
IMPRESSION: Negative.

## 2021-04-04 DIAGNOSIS — M65312 Trigger thumb, left thumb: Secondary | ICD-10-CM | POA: Diagnosis not present

## 2021-10-29 DIAGNOSIS — K8681 Exocrine pancreatic insufficiency: Secondary | ICD-10-CM | POA: Diagnosis not present

## 2021-10-29 DIAGNOSIS — Z1211 Encounter for screening for malignant neoplasm of colon: Secondary | ICD-10-CM | POA: Diagnosis not present

## 2021-10-29 DIAGNOSIS — Z1231 Encounter for screening mammogram for malignant neoplasm of breast: Secondary | ICD-10-CM | POA: Diagnosis not present

## 2021-10-29 LAB — HM MAMMOGRAPHY

## 2021-10-31 ENCOUNTER — Encounter: Payer: Self-pay | Admitting: Family Medicine

## 2022-11-04 DIAGNOSIS — Z1231 Encounter for screening mammogram for malignant neoplasm of breast: Secondary | ICD-10-CM | POA: Diagnosis not present

## 2022-11-04 LAB — HM MAMMOGRAPHY

## 2022-11-05 ENCOUNTER — Encounter: Payer: Self-pay | Admitting: Family Medicine

## 2023-08-06 ENCOUNTER — Encounter: Payer: Self-pay | Admitting: Emergency Medicine

## 2023-08-06 ENCOUNTER — Ambulatory Visit (INDEPENDENT_AMBULATORY_CARE_PROVIDER_SITE_OTHER)

## 2023-08-06 ENCOUNTER — Ambulatory Visit: Admission: EM | Admit: 2023-08-06 | Discharge: 2023-08-06 | Disposition: A | Discharge status: Home / Self Care

## 2023-08-06 DIAGNOSIS — M25521 Pain in right elbow: Secondary | ICD-10-CM | POA: Diagnosis not present

## 2023-08-06 DIAGNOSIS — M79601 Pain in right arm: Secondary | ICD-10-CM | POA: Diagnosis not present

## 2023-08-06 DIAGNOSIS — M19021 Primary osteoarthritis, right elbow: Secondary | ICD-10-CM

## 2023-08-06 DIAGNOSIS — M79631 Pain in right forearm: Secondary | ICD-10-CM | POA: Diagnosis not present

## 2023-08-06 MED ORDER — KETOROLAC TROMETHAMINE 60 MG/2ML IM SOLN
60.0000 mg | Freq: Once | INTRAMUSCULAR | Status: AC
  Administered 2023-08-06: 60 mg via INTRAMUSCULAR

## 2023-08-06 MED ORDER — PREDNISONE 20 MG PO TABS: 40.0000 mg | ORAL_TABLET | Freq: Every day | ORAL | 0 refills | Status: AC

## 2023-08-06 MED ORDER — NAPROXEN 500 MG PO TABS: 500.0000 mg | ORAL_TABLET | Freq: Two times a day (BID) | ORAL | 0 refills | Status: AC

## 2023-08-06 NOTE — ED Provider Notes (Signed)
 [facility]    CSN: [id number] Arrival date & time: 08/06/23  0102      History   Chief Complaint Chief Complaint  Patient presents with   Arm Pain   Elbow Pain    HPI Angela Ortega is a 73 y.o. female.   Angela Ortega is a 73 y.o. female with right arm pain that began yesterday morning. The pain is localized between her shoulder and elbow, with the primary focus in the elbow area. The patient reports that she woke up on Wednesday with a sore right arm, which was initially just uncomfortable between the shoulder and elbow. Despite the discomfort, she went to work and engaged in heavy lifting, which exacerbated the pain throughout the day. By Wednesday afternoon, the pain had significantly worsened. She attempted to alleviate the pain by taking Tylenol  before bed, which provided minimal relief and allowed her to sleep. However, she woke up around midnight due to pain when she moved in her sleep. She describes the pain as radiating between her shoulder and elbow, though she is uncertain of the exact origin. The pain is most noticeable with lateral arm movements or when moving her arm up and down. She notes that her left arm can move the right arm without causing pain, but any attempt to move the right arm independently results in significant discomfort. The patient reports that the pain feels more intense in the elbow area. She also mentions feeling weakness in the affected arm but denies any loss of sensation. The patient reports numbness in her hands, and she reports no pain in her neck. She has no history of issues with her right shoulder, though she mentions having had problems with her left shoulder in the past.   The following portions of the patient's history were reviewed and updated as appropriate: allergies, current medications, past family history, past medical history, past social history, past surgical history, and problem list  .    Past Medical History:   Diagnosis Date   Chronic diarrhea    since gallbladder surgery - takes Cholestyramine   Mass of finger of left hand 10/2013   ring finger    There are no active problems to display for this patient.   Past Surgical History:  Procedure Laterality Date   ABDOMINAL HYSTERECTOMY     partial   CHOLECYSTECTOMY     MASS EXCISION Left 11/03/2013   Procedure: EXCISION MASS LEFT RING FINGER;  Surgeon: Brunilda Capra, MD;  Location: [facility];  Service: Orthopedics;  Laterality: Left;   TONSILLECTOMY AND ADENOIDECTOMY  03/03/1964   WRIST FLEXION TENDON TENOTOMIES AND PROXIMAL CORPECTOMY W/ WRIST ARTHRODESIS&ILIAC CREST BONE GRAFT Right 11/2020    OB History   No obstetric history on file.      Home Medications    Prior to Admission medications   Medication Sig Start Date End Date Taking? Authorizing Provider  naproxen (NAPROSYN) 500 MG tablet Take 1 tablet (500 mg total) by mouth 2 (two) times daily with a meal. 08/06/23  Yes Maryruth Sol, FNP  predniSONE  (DELTASONE ) 20 MG tablet Take 2 tablets (40 mg total) by mouth daily for 5 days. 08/06/23 08/11/23 Yes Daeshaun Specht, FNP  cholestyramine Delfino Fellers) 4 GM/DOSE powder 4 g orally 2 times a day    [provider]  CREON 36000 units CPEP capsule TAKE 2 CAPULES AFTER EACH MEAL AND 1 CAPSULE AFTER A SNACK 10/08/17   [provider]    Family History Family  History  Problem Relation Age of Onset   Cancer Father    Heart disease Maternal Grandfather    Cancer Paternal Grandfather     Social History Social History   Tobacco Use   Smoking status: Never    Passive exposure: Never   Smokeless tobacco: Never  Vaping Use   Vaping status: Never Used  Substance Use Topics   Alcohol use: No   Drug use: No     Allergies   Dilaudid [hydromorphone hcl], Codeine, Milk-related compounds, Morphine and codeine, Sulfa antibiotics, Penicillins, and Septra [sulfamethoxazole-trimethoprim]   Review of  Systems Review of Systems  Musculoskeletal:  Negative for neck pain.       Right arm pain   Neurological:  Positive for weakness (feels weak but no loss of sensation) and numbness (some in the right hand).  Psychiatric/Behavioral:  Positive for sleep disturbance (due to arm pain).   All other systems reviewed and are negative.    Physical Exam Triage Vital Signs ED Triage Vitals  Encounter Vitals Group     BP 08/06/23 0852 (!) 154/76     Systolic BP Percentile --      Diastolic BP Percentile --      Pulse Rate 08/06/23 0852 66     Resp 08/06/23 0852 18     Temp 08/06/23 0852 97.9 F (36.6 C)     Temp Source 08/06/23 0852 Oral     SpO2 08/06/23 0852 97 %     Weight 08/06/23 0850 89 lb 4.6 oz (40.5 kg)     Height --      Head Circumference --      Peak Flow --      Pain Score 08/06/23 0848 10     Pain Loc --      Pain Education --      Exclude from Growth Chart --    No data found.  Updated Vital Signs BP (!) 154/76 (BP Location: Right Arm)   Pulse 66   Temp 97.9 F (36.6 C) (Oral)   Resp 18   Wt 89 lb 4.6 oz (40.5 kg)   SpO2 97%   BMI 18.03 kg/m   Visual Acuity Right Eye Distance:   Left Eye Distance:   Bilateral Distance:    Right Eye Near:   Left Eye Near:    Bilateral Near:     Physical Exam Vitals reviewed.  Constitutional:      General: She is awake. She is not in acute distress.    Appearance: Normal appearance. She is well-developed. She is not ill-appearing or toxic-appearing.  HENT:     Head: Normocephalic.     Mouth/Throat:     Mouth: Mucous membranes are moist.  Eyes:     Conjunctiva/sclera: Conjunctivae normal.  Cardiovascular:     Rate and Rhythm: Normal rate and regular rhythm.     Heart sounds: Normal heart sounds.  Pulmonary:     Effort: Pulmonary effort is normal.     Breath sounds: Normal breath sounds.  Musculoskeletal:        General: Normal range of motion.     Right shoulder: Normal.     Right upper arm: Normal.     Right  elbow: No swelling, deformity or effusion. Normal range of motion. Tenderness present.     Right forearm: Tenderness present. No swelling, edema, deformity or bony tenderness.     Right wrist: Normal.     Right hand: Normal.     Comments: The  right arm was examined due to reported pain. Discomfort was specifically elicited between the upper forearm and elbow during movement, with pain localized to the elbow region depending on the motion performed. The patient demonstrated full range of motion, although movement caused some discomfort. No pain was noted in the shoulder, upper arm, wrist, or hand. Strength was assessed and found to be intact, as the patient was able to squeeze the examiner's fingers with good force. NVI.    Skin:    General: Skin is warm and dry.  Neurological:     General: No focal deficit present.     Mental Status: She is alert and oriented to person, place, and time.     Sensory: Sensation is intact. No sensory deficit.     Motor: Motor function is intact. No weakness.     Coordination: Coordination is intact.  Psychiatric:        Behavior: Behavior is cooperative.      UC Treatments / Results  Labs (all labs ordered are listed, but only abnormal results are displayed) Labs Reviewed - No data to display  EKG   Radiology DG Elbow Complete Right Result Date: 08/06/2023 CLINICAL DATA:  pain to the upper forearm and elbow region x 1 day with no injury EXAM: RIGHT ELBOW - COMPLETE 3+ VIEW COMPARISON:  None Available. FINDINGS: No acute fracture or dislocation. No aggressive osseous lesion. Mild degenerative arthritis of elbow joint. No radiopaque foreign bodies. Soft tissues are within normal limits. IMPRESSION: *No acute osseous abnormality of the right elbow joint. Electronically Signed   By: Beula Brunswick M.D.   On: 08/06/2023 10:07    Procedures Procedures (including critical care time)  Medications Ordered in UC Medications  ketorolac (TORADOL) injection 60  mg (has no administration in time range)    Initial Impression / Assessment and Plan / UC Course  I have reviewed the triage vital signs and the nursing notes.  Pertinent labs & imaging results that were available during my care of the patient were reviewed by me and considered in my medical decision making (see chart for details).     Patient presents with acute right arm pain that began yesterday morning, localized between the shoulder and elbow, and worsened after heavy lifting at work. The pain is aggravated by lateral movement and arm elevation. There is no history of right shoulder issues, though the patient has had left shoulder problems in the past. Exam reveals tenderness at the elbow with radiating pain during certain movements and preserved grip strength. X-ray shows no acute fracture or dislocation, with mild degenerative changes of the elbow joint noted. Symptoms are consistent with activity-induced arthritic inflammation rather than acute injury. Toradol was administered for pain, and a short course of steroids and NSAIDs was prescribed. Supportive care with rest, ice, compression, and elevation was discussed, and an ACE wrap was applied. Patient advised to follow up with orthopedics if symptoms do not improve.  Today's evaluation has revealed no signs of a dangerous process. Discussed diagnosis with patient and/or guardian. Patient and/or guardian aware of their diagnosis, possible red flag symptoms to watch out for and need for close follow up. Patient and/or guardian understands verbal and written discharge instructions. Patient and/or guardian comfortable with plan and disposition.  Patient and/or guardian has a clear mental status at this time, good insight into illness (after discussion and teaching) and has clear judgment to make decisions regarding their care  Documentation was completed with the aid of voice recognition  software. Transcription may contain typographical  errors. Final Clinical Impressions(s) / UC Diagnoses   Final diagnoses:  Pain in right arm  Arthritis of right elbow     Discharge Instructions      You were evaluated today for acute right arm pain that began yesterday and worsened after lifting at work. Examination and X-ray findings suggest the pain is related to arthritic inflammation in the elbow joint rather than an acute injury such as a fracture or dislocation. You were given a Toradol injection in the clinic for pain relief, and a short course of oral steroids and anti-inflammatory medication has been prescribed to help reduce inflammation. While taking these medications, do not use over-the-counter anti-inflammatories such as aspirin, Motrin, ibuprofen, or Aleve, as this may increase the risk of side effects. If needed, you may take Tylenol  (acetaminophen ) 1000 mg every six hours for additional pain relief. This equals two 500 mg tablets at a time. Be careful not to take more than 4000 mg of Tylenol  in a 24-hour period.   At home, continue supportive care by resting the affected arm, applying ice for 15-20 minutes at a time several times a day. Always place a towel between your skin and the ice pack to avoid skin damage. Compression with the ACE wrap provided. Avoid heavy lifting, repetitive arm motions, or strenuous use of the right arm until symptoms improve.  Follow up with your primary care provider or an orthopedic specialist if your pain persists beyond a few days, worsens despite treatment, or begins to interfere with daily activities.   Seek emergency care if you develop severe swelling, inability to move the arm, increasing weakness, numbness or tingling, signs of infection such as fever or redness spreading from the joint, or any new or worsening symptoms.    ED Prescriptions     Medication Sig Dispense Auth. Provider   predniSONE  (DELTASONE ) 20 MG tablet Take 2 tablets (40 mg total) by mouth daily for 5 days. 10 tablet  Maryruth Sol, FNP   naproxen (NAPROSYN) 500 MG tablet Take 1 tablet (500 mg total) by mouth 2 (two) times daily with a meal. 20 tablet Maryruth Sol, FNP      PDMP not reviewed this encounter.   Maryruth Sol, [place] 08/06/23 1023

## 2023-08-06 NOTE — Discharge Instructions (Addendum)
 You were evaluated today for acute right arm pain that began yesterday and worsened after lifting at work. Examination and X-ray findings suggest the pain is related to arthritic inflammation in the elbow joint rather than an acute injury such as a fracture or dislocation. You were given a Toradol injection in the clinic for pain relief, and a short course of oral steroids and anti-inflammatory medication has been prescribed to help reduce inflammation. While taking these medications, do not use over-the-counter anti-inflammatories such as aspirin, Motrin, ibuprofen, or Aleve, as this may increase the risk of side effects. If needed, you may take Tylenol  (acetaminophen ) 1000 mg every six hours for additional pain relief. This equals two 500 mg tablets at a time. Be careful not to take more than 4000 mg of Tylenol  in a 24-hour period.   At home, continue supportive care by resting the affected arm, applying ice for 15-20 minutes at a time several times a day. Always place a towel between your skin and the ice pack to avoid skin damage. Compression with the ACE wrap provided. Avoid heavy lifting, repetitive arm motions, or strenuous use of the right arm until symptoms improve.  Follow up with your primary care provider or an orthopedic specialist if your pain persists beyond a few days, worsens despite treatment, or begins to interfere with daily activities.   Seek emergency care if you develop severe swelling, inability to move the arm, increasing weakness, numbness or tingling, signs of infection such as fever or redness spreading from the joint, or any new or worsening symptoms.

## 2023-08-06 NOTE — ED Triage Notes (Signed)
 Pt c/o arm pain x 2 days. She states she first noticed it yesterday morning and took Tylenol  to relieve sxs. She then states last night around midnight she couldn't move it without assistance from the left arm. The pain is mostly in her elbow and radiates to her shoulder. Pt denies having had any falls and/or injury.

## 2023-08-12 DIAGNOSIS — M67911 Unspecified disorder of synovium and tendon, right shoulder: Secondary | ICD-10-CM | POA: Diagnosis not present

## 2023-08-18 DIAGNOSIS — M6281 Muscle weakness (generalized): Secondary | ICD-10-CM | POA: Diagnosis not present

## 2023-08-18 DIAGNOSIS — M7541 Impingement syndrome of right shoulder: Secondary | ICD-10-CM | POA: Diagnosis not present

## 2023-08-19 DIAGNOSIS — M6281 Muscle weakness (generalized): Secondary | ICD-10-CM | POA: Diagnosis not present

## 2023-08-19 DIAGNOSIS — M7541 Impingement syndrome of right shoulder: Secondary | ICD-10-CM | POA: Diagnosis not present

## 2023-08-25 DIAGNOSIS — M6281 Muscle weakness (generalized): Secondary | ICD-10-CM | POA: Diagnosis not present

## 2023-08-25 DIAGNOSIS — M7541 Impingement syndrome of right shoulder: Secondary | ICD-10-CM | POA: Diagnosis not present

## 2023-08-31 DIAGNOSIS — M6281 Muscle weakness (generalized): Secondary | ICD-10-CM | POA: Diagnosis not present

## 2023-08-31 DIAGNOSIS — M7541 Impingement syndrome of right shoulder: Secondary | ICD-10-CM | POA: Diagnosis not present

## 2023-09-03 DIAGNOSIS — M7541 Impingement syndrome of right shoulder: Secondary | ICD-10-CM | POA: Diagnosis not present

## 2023-09-03 DIAGNOSIS — M6281 Muscle weakness (generalized): Secondary | ICD-10-CM | POA: Diagnosis not present

## 2023-09-07 DIAGNOSIS — M7541 Impingement syndrome of right shoulder: Secondary | ICD-10-CM | POA: Diagnosis not present

## 2023-09-07 DIAGNOSIS — M6281 Muscle weakness (generalized): Secondary | ICD-10-CM | POA: Diagnosis not present

## 2023-09-09 DIAGNOSIS — M7541 Impingement syndrome of right shoulder: Secondary | ICD-10-CM | POA: Diagnosis not present

## 2023-09-09 DIAGNOSIS — M6281 Muscle weakness (generalized): Secondary | ICD-10-CM | POA: Diagnosis not present

## 2023-09-23 DIAGNOSIS — M7711 Lateral epicondylitis, right elbow: Secondary | ICD-10-CM | POA: Diagnosis not present

## 2023-09-23 DIAGNOSIS — M6281 Muscle weakness (generalized): Secondary | ICD-10-CM | POA: Diagnosis not present

## 2023-09-23 DIAGNOSIS — M7541 Impingement syndrome of right shoulder: Secondary | ICD-10-CM | POA: Diagnosis not present

## 2023-09-23 DIAGNOSIS — M67911 Unspecified disorder of synovium and tendon, right shoulder: Secondary | ICD-10-CM | POA: Diagnosis not present

## 2023-09-29 DIAGNOSIS — M7541 Impingement syndrome of right shoulder: Secondary | ICD-10-CM | POA: Diagnosis not present

## 2023-09-29 DIAGNOSIS — M6281 Muscle weakness (generalized): Secondary | ICD-10-CM | POA: Diagnosis not present

## 2023-10-01 DIAGNOSIS — R634 Abnormal weight loss: Secondary | ICD-10-CM | POA: Diagnosis not present

## 2023-10-01 DIAGNOSIS — Z1211 Encounter for screening for malignant neoplasm of colon: Secondary | ICD-10-CM | POA: Diagnosis not present

## 2023-10-01 DIAGNOSIS — M7541 Impingement syndrome of right shoulder: Secondary | ICD-10-CM | POA: Diagnosis not present

## 2023-10-01 DIAGNOSIS — K8681 Exocrine pancreatic insufficiency: Secondary | ICD-10-CM | POA: Diagnosis not present

## 2023-10-01 DIAGNOSIS — M6281 Muscle weakness (generalized): Secondary | ICD-10-CM | POA: Diagnosis not present

## 2023-10-01 DIAGNOSIS — R1013 Epigastric pain: Secondary | ICD-10-CM | POA: Diagnosis not present

## 2023-10-01 DIAGNOSIS — R197 Diarrhea, unspecified: Secondary | ICD-10-CM | POA: Diagnosis not present

## 2023-10-06 DIAGNOSIS — M6281 Muscle weakness (generalized): Secondary | ICD-10-CM | POA: Diagnosis not present

## 2023-10-06 DIAGNOSIS — M7541 Impingement syndrome of right shoulder: Secondary | ICD-10-CM | POA: Diagnosis not present

## 2023-10-08 DIAGNOSIS — M7541 Impingement syndrome of right shoulder: Secondary | ICD-10-CM | POA: Diagnosis not present

## 2023-10-08 DIAGNOSIS — M6281 Muscle weakness (generalized): Secondary | ICD-10-CM | POA: Diagnosis not present

## 2023-10-12 DIAGNOSIS — M6281 Muscle weakness (generalized): Secondary | ICD-10-CM | POA: Diagnosis not present

## 2023-10-12 DIAGNOSIS — M7541 Impingement syndrome of right shoulder: Secondary | ICD-10-CM | POA: Diagnosis not present

## 2023-11-10 DIAGNOSIS — Z1231 Encounter for screening mammogram for malignant neoplasm of breast: Secondary | ICD-10-CM | POA: Diagnosis not present

## 2023-11-17 DIAGNOSIS — Z1211 Encounter for screening for malignant neoplasm of colon: Secondary | ICD-10-CM | POA: Diagnosis not present

## 2023-11-17 DIAGNOSIS — R109 Unspecified abdominal pain: Secondary | ICD-10-CM | POA: Diagnosis not present

## 2023-11-17 DIAGNOSIS — R197 Diarrhea, unspecified: Secondary | ICD-10-CM | POA: Diagnosis not present

## 2023-11-17 DIAGNOSIS — R634 Abnormal weight loss: Secondary | ICD-10-CM | POA: Diagnosis not present

## 2024-01-14 DIAGNOSIS — R634 Abnormal weight loss: Secondary | ICD-10-CM | POA: Diagnosis not present

## 2024-01-14 DIAGNOSIS — I878 Other specified disorders of veins: Secondary | ICD-10-CM | POA: Diagnosis not present

## 2024-01-14 DIAGNOSIS — R109 Unspecified abdominal pain: Secondary | ICD-10-CM | POA: Diagnosis not present

## 2024-01-19 ENCOUNTER — Other Ambulatory Visit: Payer: Self-pay | Admitting: Gastroenterology

## 2024-01-19 DIAGNOSIS — R140 Abdominal distension (gaseous): Secondary | ICD-10-CM

## 2024-01-19 DIAGNOSIS — R935 Abnormal findings on diagnostic imaging of other abdominal regions, including retroperitoneum: Secondary | ICD-10-CM

## 2024-01-19 DIAGNOSIS — R634 Abnormal weight loss: Secondary | ICD-10-CM

## 2024-01-22 ENCOUNTER — Ambulatory Visit
Admission: RE | Admit: 2024-01-22 | Discharge: 2024-01-22 | Disposition: A | Discharge status: Home / Self Care | Source: Ambulatory Visit | Attending: Gastroenterology | Admitting: Gastroenterology

## 2024-01-22 DIAGNOSIS — R935 Abnormal findings on diagnostic imaging of other abdominal regions, including retroperitoneum: Secondary | ICD-10-CM

## 2024-01-22 DIAGNOSIS — R634 Abnormal weight loss: Secondary | ICD-10-CM

## 2024-01-22 DIAGNOSIS — K862 Cyst of pancreas: Secondary | ICD-10-CM | POA: Diagnosis not present

## 2024-01-22 DIAGNOSIS — R140 Abdominal distension (gaseous): Secondary | ICD-10-CM

## 2024-01-22 MED ORDER — IOPAMIDOL (ISOVUE-370) INJECTION 76%
75.0000 mL | Freq: Once | INTRAVENOUS | Status: AC | PRN
  Administered 2024-01-22: 75 mL via INTRAVENOUS

## 2024-03-01 ENCOUNTER — Ambulatory Visit
Attending: Student in an Organized Health Care Education/Training Program | Admitting: Student in an Organized Health Care Education/Training Program

## 2024-03-01 ENCOUNTER — Encounter: Payer: Self-pay | Admitting: Student in an Organized Health Care Education/Training Program

## 2024-03-01 VITALS — BP 153/78 | HR 74 | Ht 60.0 in | Wt 88.5 lb

## 2024-03-01 DIAGNOSIS — Z13220 Encounter for screening for lipoid disorders: Secondary | ICD-10-CM | POA: Diagnosis not present

## 2024-03-01 DIAGNOSIS — R030 Elevated blood-pressure reading, without diagnosis of hypertension: Secondary | ICD-10-CM | POA: Diagnosis not present

## 2024-03-01 DIAGNOSIS — I3139 Other pericardial effusion (noninflammatory): Secondary | ICD-10-CM | POA: Diagnosis not present

## 2024-03-01 NOTE — Progress Notes (Signed)
 " Cardiology Office Note:   Date:  03/01/2024  ID:  Angela Ortega, DOB 11/17/1950, MRN [id number] PCP: Levora Reyes SAUNDERS, MD  [facility] Providers Cardiologist:  Georganna Archer, MD { Chief Complaint:  Chief Complaint  Patient presents with   Pericardial Effusion      History of Present Illness:   Angela Ortega is a 73 y.o. female with a PMH of cholelithiasis s/p cholecystectomy and pancreatic insufficiency who presents as a new patient referral by Dr. Elsie Cree for the evaluation of pericardial effusion.  Patient presents today for evaluation of pericardial effusion.  She states that for many years she has dealt with GI issues that were initially thought to be IBS and she was later diagnosed with exocrine pancreatic insufficiency.  The symptoms have been well-controlled until recently when she had recurrence of GI upset, weight loss, and anorexia despite adherence to Creon.  She underwent CT A/P for evaluation on 01/22/2024 for abdominal discomfort which incidentally noted a moderate pericardial effusion prompting referral today.  The patient was also found to have a cystic lesion in the uncinate process of the pancreas that had an interval increase in the size from her previous CT scan concerning for IPMN.  She is followed by GI for this.  She reports that her family history is notable for hypertension.  She denies tobacco, alcohol, and illicit drug use.  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  She denies chest pain, SOB, PND, orthopnea, swelling, syncope and presyncope.  She also expresses concern about her overall cardiovascular health and wants to learn more about her cardiovascular risk.   Past Medical History:  Diagnosis Date   Chronic diarrhea    since gallbladder surgery - takes Cholestyramine   Mass of finger of left hand 10/2013   ring finger     Studies Reviewed:    EKG:  EKG Interpretation Date/Time:  Tuesday  March 01 2024 13:58:27 EST Ventricular Rate:  74 PR Interval:  148 QRS Duration:  74 QT Interval:  408 QTC Calculation: 452 R Axis:   73  Text Interpretation: Normal sinus rhythm Low voltage QRS No previous ECGs available Confirmed by Archer Georganna [phone]) on 03/01/2024 2:14:03 PM         Risk Assessment/Calculations:           Physical Exam:     VS:  BP (!) 153/78 (BP Location: Right Arm, Patient Position: Sitting, Cuff Size: Small)   Pulse 74   Ht 5' (1.524 m)   Wt 88 lb 8 oz (40.1 kg)   SpO2 98%   BMI 17.28 kg/m      Wt Readings from Last 3 Encounters:  08/06/23 89 lb 4.6 oz (40.5 kg)  10/19/17 89 lb 3.2 oz (40.5 kg)  02/14/15 103 lb 6.4 oz (46.9 kg)     GEN: Thin appearing female in NAD, very pleasant NECK: No JVD; No carotid bruits CARDIAC: RRR, no murmurs, rubs, gallops RESPIRATORY:  Clear to auscultation without rales, wheezing or rhonchi  ABDOMEN: Soft, non-tender, non-distended, normal bowel sounds EXTREMITIES:  Warm and well perfused, no edema; No deformity, 2+ radial pulses PSYCH: Normal mood and affect   Assessment & Plan Pericardial effusion - Patient noted to have a moderate pericardial effusion on CT imaging.  I am truly uncertain about the size of the pericardial effusion seen because only the inferior aspect of the heart was imaged on the CT scan.  Is important to note that this fluid seen around the heart was only seen adjacent to the RV since the entire heart was not imaged but this could represent a pericardial cyst as well. -Regardless, the patient is well compensated and has no symptoms and so I doubt that she has a large pericardial effusion.  The best way to evaluate this would be via an echocardiogram which I will order. Complete echocardiogram Screening, lipid - The patient wants to learn more about her cardiovascular risk profile. -Will check the lipid panel lipoprotein a today. -She is also interested in a CAC score. Lipid  panel Lipoprotein a CAC score Elevated blood pressure reading - BP was elevated today, but the patient says that she does not have known high blood pressure. - I instructed her to keep a 2-week blood pressure log twice daily and to let me know the results. - We can decide on the treatment as indicated at that time. 2-week blood pressure log Follow-up in 3 months          This note was written with the assistance of a dictation microphone or AI dictation software. Please excuse any typos or grammatical errors.   Signed, Georganna Archer, MD 03/01/2024 12:48 PM    [facility]  "

## 2024-03-01 NOTE — Patient Instructions (Signed)
 " Lab Work: Lipid panel  Lp(a) Bmp  If you have labs (blood work) drawn today and your tests are completely normal, you will receive your results only by: MyChart Message (if you have MyChart) OR A paper copy in the mail If you have any lab test that is abnormal or we need to change your treatment, we will call you to review the results.  Testing/Procedures: Echocardiogram  Your physician has requested that you have an echocardiogram. Echocardiography is a painless test that uses sound waves to create images of your heart. It provides your doctor with information about the size and shape of your heart and how well your hearts chambers and valves are working. This procedure takes approximately one hour. There are no restrictions for this procedure. Please do NOT wear cologne, perfume, aftershave, or lotions (deodorant is allowed). Please arrive 15 minutes prior to your appointment time.  Please note: We ask at that you not bring children with you during ultrasound (echo/ vascular) testing. Due to room size and safety concerns, children are not allowed in the ultrasound rooms during exams. Our front office staff cannot provide observation of children in our lobby area while testing is being conducted. An adult accompanying a patient to their appointment will only be allowed in the ultrasound room at the discretion of the ultrasound technician under special circumstances. We apologize for any inconvenience.   Calcium score   Your provider would like for you to have a Calcium Score CT. This test is painless. This is a non-contrast CT of the heat to look for calcified lesions in the coronary arteries. The cost of this is test cost $99 out of pocket and is not submitted to your insurance. The test can be performed at our [facility] Location in [place].  You can get the test scheduled in our office or you can call [phone].  Then press option 4, Then press option 2 Finally  press option 2 and get your test scheduled.    Follow-Up: At [facility], you and your health needs are our priority.  As part of our continuing mission to provide you with exceptional heart care, our providers are all part of one team.  This team includes your primary Cardiologist (physician) and Advanced Practice Providers or APPs (Physician Assistants and Nurse Practitioners) who all work together to provide you with the care you need, when you need it.  Your next appointment:   3 month(s)  Provider:   Georganna Archer, MD    Other Instructions  CHECK BLOOD PRESSURE TWICE DAILY FOR 2 WEEKS AND CONTACT OFFICE TO ADVISE RESULTS  Blood Pressure Record Sheet To take your blood pressure, you will need a blood pressure machine. You can buy a blood pressure machine (blood pressure monitor) at your clinic, drug store, or online. When choosing one, consider: An automatic monitor that has an arm cuff. A cuff that wraps snugly around your upper arm. You should be able to fit only one finger between your arm and the cuff. A device that stores blood pressure reading results. Do not choose a monitor that measures your blood pressure from your wrist or finger. Follow your health care provider's instructions for how to take your blood pressure. To use this form: Take your blood pressure medications every day These measurements should be taken when you have been at rest for at least 10-15 min Take at least 2 readings with each blood pressure check. This makes sure the results are correct.  Wait 1-2 minutes between measurements. Write down the results in the spaces on this form. Keep in mind it should always be recorded systolic over diastolic. Both numbers are important.  Repeat this every day for 2-3 weeks, or as told by your health care provider.  Make a follow-up appointment with your health care provider to discuss the results.  Blood Pressure Log Date Medications taken? (Y/N) Blood  Pressure Time of Day                                                                                                               "

## 2024-03-02 ENCOUNTER — Ambulatory Visit: Payer: Self-pay | Admitting: Student in an Organized Health Care Education/Training Program

## 2024-03-02 DIAGNOSIS — E782 Mixed hyperlipidemia: Secondary | ICD-10-CM

## 2024-03-02 DIAGNOSIS — R911 Solitary pulmonary nodule: Secondary | ICD-10-CM

## 2024-03-02 LAB — BASIC METABOLIC PANEL WITH GFR
BUN/Creatinine Ratio: 24 (ref 12–28)
BUN: 21 mg/dL (ref 8–27)
CO2: 26 mmol/L (ref 20–29)
Calcium: 9.9 mg/dL (ref 8.7–10.3)
Chloride: 102 mmol/L (ref 96–106)
Creatinine, Ser: 0.88 mg/dL (ref 0.57–1.00)
Glucose: 104 mg/dL — ABNORMAL HIGH (ref 70–99)
Potassium: 4.1 mmol/L (ref 3.5–5.2)
Sodium: 144 mmol/L (ref 134–144)
eGFR: 70 mL/min/1.73

## 2024-03-02 LAB — LIPID PANEL
Chol/HDL Ratio: 2.8 ratio (ref 0.0–4.4)
Cholesterol, Total: 185 mg/dL (ref 100–199)
HDL: 66 mg/dL
LDL Chol Calc (NIH): 107 mg/dL — ABNORMAL HIGH (ref 0–99)
Triglycerides: 65 mg/dL (ref 0–149)
VLDL Cholesterol Cal: 12 mg/dL (ref 5–40)

## 2024-03-02 LAB — LIPOPROTEIN A (LPA): Lipoprotein (a): 89.1 nmol/L — ABNORMAL HIGH

## 2024-03-04 ENCOUNTER — Ambulatory Visit (HOSPITAL_COMMUNITY)
Admission: RE | Admit: 2024-03-04 | Discharge: 2024-03-04 | Disposition: A | Discharge status: Home / Self Care | Payer: Self-pay | Source: Ambulatory Visit | Attending: Internal Medicine | Admitting: Internal Medicine

## 2024-03-04 DIAGNOSIS — I3139 Other pericardial effusion (noninflammatory): Secondary | ICD-10-CM | POA: Insufficient documentation

## 2024-03-04 DIAGNOSIS — Z13220 Encounter for screening for lipoid disorders: Secondary | ICD-10-CM | POA: Insufficient documentation

## 2024-03-07 MED ORDER — ROSUVASTATIN CALCIUM 5 MG PO TABS: 5.0000 mg | ORAL_TABLET | Freq: Every day | ORAL | 3 refills | Status: AC

## 2024-03-18 NOTE — Addendum Note (Signed)
 Addended by: MANDA BOTTCHER B on: 03/18/2024 04:05 PM   Modules accepted: Orders

## 2024-03-30 ENCOUNTER — Encounter (HOSPITAL_COMMUNITY): Payer: Self-pay | Admitting: Radiology

## 2024-03-30 ENCOUNTER — Ambulatory Visit: Admitting: Student in an Organized Health Care Education/Training Program

## 2024-03-30 ENCOUNTER — Ambulatory Visit (HOSPITAL_COMMUNITY)
Admission: RE | Admit: 2024-03-30 | Discharge: 2024-03-30 | Disposition: A | Discharge status: Home / Self Care | Source: Ambulatory Visit | Attending: Cardiovascular Disease | Admitting: Cardiovascular Disease

## 2024-03-30 VITALS — BP 130/60 | HR 85 | Ht 60.0 in | Wt 88.3 lb

## 2024-03-30 DIAGNOSIS — E782 Mixed hyperlipidemia: Secondary | ICD-10-CM | POA: Diagnosis not present

## 2024-03-30 DIAGNOSIS — I3139 Other pericardial effusion (noninflammatory): Secondary | ICD-10-CM | POA: Insufficient documentation

## 2024-03-30 DIAGNOSIS — Z79899 Other long term (current) drug therapy: Secondary | ICD-10-CM | POA: Insufficient documentation

## 2024-03-30 DIAGNOSIS — E785 Hyperlipidemia, unspecified: Secondary | ICD-10-CM | POA: Insufficient documentation

## 2024-03-30 LAB — ECHOCARDIOGRAM COMPLETE
Area-P 1/2: 3.26 cm2
S' Lateral: 1.9 cm

## 2024-03-30 NOTE — Progress Notes (Signed)
 Patient scheduled with and seen by Dr. Floretta regarding echocardiogram results per Dr. Zenaida, echocardiogram reader of the day. Patient discharged from the echo lab alert and oriented and without symptoms.

## 2024-03-30 NOTE — Patient Instructions (Signed)
 Medication Instructions:  Your physician recommends that you continue on your current medications as directed. Please refer to the Current Medication list given to you today.  *If you need a refill on your cardiac medications before your next appointment, please call your pharmacy*  Lab Work: Please complete a TSH, CRP, and ESR today in our first floor lab before you leave.   If you have labs (blood work) drawn today and your tests are completely normal, you will receive your results only by: MyChart Message (if you have MyChart) OR A paper copy in the mail If you have any lab test that is abnormal or we need to change your treatment, we will call you to review the results.  Testing/Procedures: Your physician has requested that you have an echocardiogram in 6 months. Echocardiography is a painless test that uses sound waves to create images of your heart. It provides your doctor with information about the size and shape of your heart and how well your hearts chambers and valves are working. This procedure takes approximately one hour. There are no restrictions for this procedure. Please do NOT wear cologne, perfume, aftershave, or lotions (deodorant is allowed). Please arrive 15 minutes prior to your appointment time.  Please note: We ask at that you not bring children with you during ultrasound (echo/ vascular) testing. Due to room size and safety concerns, children are not allowed in the ultrasound rooms during exams. Our front office staff cannot provide observation of children in our lobby area while testing is being conducted. An adult accompanying a patient to their appointment will only be allowed in the ultrasound room at the discretion of the ultrasound technician under special circumstances. We apologize for any inconvenience.   Follow-Up: At [facility], you and your health needs are our priority.  As part of our continuing mission to provide you with exceptional heart  care, our providers are all part of one team.  This team includes your primary Cardiologist (physician) and Advanced Practice Providers or APPs (Physician Assistants and Nurse Practitioners) who all work together to provide you with the care you need, when you need it.  Your next appointment:   6 month(s) (*Please complete echocardiogram prior to this appointment*)  Provider:   Georganna Archer, MD    We recommend signing up for the patient portal called MyChart.  Sign up information is provided on this After Visit Summary.  MyChart is used to connect with patients for Virtual Visits (Telemedicine).  Patients are able to view lab/test results, encounter notes, upcoming appointments, etc.  Non-urgent messages can be sent to your provider as well.   To learn more about what you can do with MyChart, go to [email or web address].

## 2024-03-30 NOTE — Progress Notes (Addendum)
 " Cardiology Office Note:  .   Date:  03/30/2024  ID:  Angela Ortega, DOB 1950/07/03, MRN [id number] PCP: Levora Reyes SAUNDERS, MD  [facility] Providers Cardiologist:  Georganna Archer, MD { Chief Complaint:  Chief Complaint  Patient presents with   Pericardial Effusion    History of Present Illness: .    Angela Ortega is a 74 y.o. female with a PMH of large pericardial effusion, nonobstructive CAD, HLD, cholelithiasis s/p cholecystectomy and pancreatic insufficiency who presents for follow-up.   Discussed the use of AI scribe software for clinical note transcription with the patient, who gave verbal consent to proceed.  History of Present Illness [name] is a 74 year old female who presents with a large pericardial effusion.  A recent echocardiogram identified a large pericardial effusion. No symptoms such as shortness of breath, tingling, or chest pain are present. There is no history of heart failure or pericarditis.  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 There is no known history of cancer. She reports no significant changes in her physical activity and is able to exert herself without experiencing shortness of breath or other physical problems, although she sometimes feels anxious about overexerting herself.  Her blood pressure readings have been mostly stable, with occasional higher readings in the morning and one instance of a lower reading in the evening. She monitors her blood pressure regularly, with a highest reading of 133/71.  During the review of symptoms, she reports no shortness of breath, inability to lay flat, or fast heart rate, which are symptoms that could indicate cardiac tamponade.      Studies Reviewed: [name]    EKG: No new ECG       Cardiac Studies & Procedures    ______________________________________________________________________________________________     ECHOCARDIOGRAM  ECHOCARDIOGRAM COMPLETE 03/30/2024  Narrative ECHOCARDIOGRAM REPORT    Patient Name:   Angela Ortega Date of Exam: 03/30/2024 Medical Rec #:  [id number]       Height:       60.0 in Accession #:    [id number]      Weight:       88.5 lb Date of Birth:  12/16/1950       BSA:          1.320 m Patient Age:    73 years        BP:           153/78 mmHg Patient Gender: F               HR:           74 bpm. Exam Location:  [address]  Procedure: 2D Echo, Cardiac Doppler and Color Doppler (Both Spectral and Color Flow Doppler were utilized during procedure).  Indications:    I31.3 Pericardial Effusion  History:        Patient has no prior history of Echocardiogram examinations.  Sonographer:    Carl Coma RDCS Referring Phys: [id number] GEORGANNA ARCHER  IMPRESSIONS   1. Left ventricular ejection fraction, by estimation, is 60 to 65%. The left ventricle has normal function. The left ventricle has no regional wall motion abnormalities. Left ventricular diastolic parameters are consistent with Grade I diastolic dysfunction (impaired relaxation). 2. Right ventricular systolic function is normal. The right ventricular size is normal. 3. Large circumferential pericardial effusion. No significant variation in inflow velocities, normal heart rate, and  while IVC is noncollapsible is not significantly dilated. However, there is evidence of diastolic RA/RV compression. Will have evaluated by doctor of the day for potential scheduled drainage. Large pericardial effusion. The pericardial effusion is circumferential. 4. The mitral valve is normal in structure. No evidence of mitral valve regurgitation. No evidence of mitral stenosis. 5. The aortic valve is normal in structure. Aortic valve regurgitation is not visualized. No aortic stenosis is present. 6. The inferior vena  cava is normal in size with greater than 50% respiratory variability, suggesting right atrial pressure of 3 mmHg.  FINDINGS Left Ventricle: Left ventricular ejection fraction, by estimation, is 60 to 65%. The left ventricle has normal function. The left ventricle has no regional wall motion abnormalities. The left ventricular internal cavity size was normal in size. There is no left ventricular hypertrophy. Left ventricular diastolic parameters are consistent with Grade I diastolic dysfunction (impaired relaxation).  Right Ventricle: The right ventricular size is normal. No increase in right ventricular wall thickness. Right ventricular systolic function is normal.  Left Atrium: Left atrial size was normal in size.  Right Atrium: Right atrial size was normal in size.  Pericardium: Large circumferential pericardial effusion. No significant variation in inflow velocities, normal heart rate, and while IVC is noncollapsible is not significantly dilated. However, there is evidence of diastolic RA/RV compression. Will have evaluated by doctor of the day for potential scheduled drainage. A large pericardial effusion is present. The pericardial effusion is circumferential. There is diastolic collapse of the right ventricular free wall and diastolic collapse of the right atrial wall.  Mitral Valve: The mitral valve is normal in structure. No evidence of mitral valve regurgitation. No evidence of mitral valve stenosis.  Tricuspid Valve: The tricuspid valve is normal in structure. Tricuspid valve regurgitation is mild . No evidence of tricuspid stenosis.  Aortic Valve: The aortic valve is normal in structure. Aortic valve regurgitation is not visualized. No aortic stenosis is present.  Pulmonic Valve: The pulmonic valve was normal in structure. Pulmonic valve regurgitation is not visualized. No evidence of pulmonic stenosis.  Aorta: The aortic root is normal in size and structure.  Venous: The  inferior vena cava is normal in size with greater than 50% respiratory variability, suggesting right atrial pressure of 3 mmHg.  IAS/Shunts: No atrial level shunt detected by color flow Doppler.   LEFT VENTRICLE PLAX 2D LVIDd:         3.70 cm   Diastology LVIDs:         1.90 cm   LV e' medial:    6.09 cm/s LV PW:         0.60 cm   LV E/e' medial:  11.2 LV IVS:        0.60 cm   LV e' lateral:   5.88 cm/s LVOT diam:     1.70 cm   LV E/e' lateral: 11.6 LV SV:         48 LV SV Index:   36 LVOT Area:     2.27 cm   RIGHT VENTRICLE             IVC RV Basal diam:  2.90 cm     IVC diam: 1.30 cm RV S prime:     16.30 cm/s TAPSE (M-mode): 2.0 cm      PULMONARY VEINS Diastolic Velocity: 60.30 cm/s S/D Velocity:       1.10 Systolic Velocity:  67.60 cm/s  LEFT ATRIUM  Index        RIGHT ATRIUM          Index LA diam:        3.10 cm 2.35 cm/m   RA Area:     6.80 cm LA Vol (A2C):   25.7 ml 19.47 ml/m  RA Volume:   12.00 ml 9.09 ml/m LA Vol (A4C):   22.5 ml 17.05 ml/m LA Biplane Vol: 24.2 ml 18.33 ml/m AORTIC VALVE LVOT Vmax:   114.00 cm/s LVOT Vmean:  72.100 cm/s LVOT VTI:    0.211 m  AORTA Ao Root diam: 2.90 cm Ao Asc diam:  3.10 cm  MITRAL VALVE               TRICUSPID VALVE MV Area (PHT): 3.26 cm    TR Peak grad:   20.4 mmHg MV Decel Time: 233 msec    TR Vmax:        226.00 cm/s MV E velocity: 68.15 cm/s MV A velocity: 86.95 cm/s  SHUNTS MV E/A ratio:  0.78        Systemic VTI:  0.21 m Systemic Diam: 1.70 cm  Morene Brownie Electronically signed by Morene Brownie Signature Date/Time: 03/30/2024/8:27:57 AM    Final      CT SCANS  CT CARDIAC SCORING (SELF PAY ONLY) 03/04/2024  Addendum 03/17/2024  5:57 PM ADDENDUM REPORT: 03/17/2024 17:54  EXAM: OVER-READ INTERPRETATION  CT CHEST  The following report is an over-read performed by radiologist Dr. Fonda Mom [facility] on 03/17/2024. This over-read does not include  interpretation of cardiac or coronary anatomy or pathology. The coronary calcium  score interpretation by the cardiologist is attached.  COMPARISON:  None.  FINDINGS: Cardiovascular: See findings discussed in the body of the report. Large pericardial effusion.  Mediastinum/Nodes: No suspicious adenopathy identified. Imaged mediastinal structures are unremarkable.  Lungs/Pleura: 6 mm left upper lobe nodule. No pleural effusion or pneumothorax.  Upper Abdomen: No acute abnormality.  Musculoskeletal: No chest wall abnormality. No acute osseous findings.  IMPRESSION: 1. 6 mm left upper lobe nodule. Non-contrast chest CT at 6-12 months is recommended. If the nodule is stable at time of repeat CT, then future CT at 18-24 months (from today's scan) is considered optional for low-risk patients, but is recommended for high-risk patients. This recommendation follows the consensus statement: Guidelines for Management of Incidental Pulmonary Nodules Detected on CT Images: From the Fleischner Society 2017; Radiology 2017; 284:228-243. 2. Large pericardial effusion.   Electronically Signed By: Fonda Field M.D. On: 03/17/2024 17:54  Narrative CLINICAL DATA:  Cardiovascular Disease Risk stratification  EXAM: Coronary Calcium  Score  TECHNIQUE: A gated, non-contrast computed tomography scan of the heart was performed using 3mm slice thickness. Axial images were analyzed on a dedicated workstation. Calcium  scoring of the coronary arteries was performed using the Agatston method.  FINDINGS: Coronary arteries: Normal origins.  Coronary Calcium  Score:  Left main: 0  Left anterior descending artery: 53  Left circumflex artery: 0  Right coronary artery: 0  Total: 53  Percentile: 61  Pericardium: Normal.  Ascending Aorta: Normal caliber.  Moderate pericardial effusion  Non-cardiac: See separate report from [facility].  IMPRESSION: Coronary calcium  score  of 53. This was 61st percentile for age-, race-, and sex-matched controls.  Moderate pericardial effusion.  Echocardiogram recommended  RECOMMENDATIONS: Coronary artery calcium  (CAC) score is a strong predictor of incident coronary heart disease (CHD) and provides predictive information beyond traditional risk factors. CAC scoring is reasonable to use in the decision  to withhold, postpone, or initiate statin therapy in intermediate-risk or selected borderline-risk asymptomatic adults (age 39-75 years and LDL-C >=70 to <190 mg/dL) who do not have diabetes or established atherosclerotic cardiovascular disease (ASCVD).* In intermediate-risk (10-year ASCVD risk >=7.5% to <20%) adults or selected borderline-risk (10-year ASCVD risk >=5% to <7.5%) adults in whom a CAC score is measured for the purpose of making a treatment decision the following recommendations have been made:  If CAC=0, it is reasonable to withhold statin therapy and reassess in 5 to 10 years, as long as higher risk conditions are absent (diabetes mellitus, family history of premature CHD in first degree relatives (males <55 years; females <65 years), cigarette smoking, or LDL >=190 mg/dL).  If CAC is 1 to 99, it is reasonable to initiate statin therapy for patients >=49 years of age.  If CAC is >=100 or >=75th percentile, it is reasonable to initiate statin therapy at any age.  Cardiology referral should be considered for patients with CAC scores >=400 or >=75th percentile.  *2018 AHA/ACC/AACVPR/AAPA/ABC/ACPM/ADA/AGS/APhA/ASPC/NLA/PCNA Guideline on the Management of Blood Cholesterol: A Report of the American College of Cardiology/American Heart Association Task Force on Clinical Practice Guidelines. J Am Coll Cardiol. 2019;73(24):3168-3209.  Lonni Nanas, MD  Electronically Signed: By: Lonni Nanas M.D. On: 03/05/2024 20:38      ______________________________________________________________________________________________      Results Diagnostic Echocardiogram: Large pericardial effusion, no evidence of cardiac tamponade, normal biventricular systolic function, no pericardial inflammation, no hemodynamic compromise  Risk Assessment/Calculations:               Physical Exam:    VS:  BP 130/60 (BP Location: Right Arm, Patient Position: Sitting)   Pulse 85   Ht 5' (1.524 m)   Wt 88 lb 4.8 oz (40.1 kg)   SpO2 98%   BMI 17.24 kg/m      Wt Readings from Last 3 Encounters:  03/01/24 88 lb 8 oz (40.1 kg)  08/06/23 89 lb 4.6 oz (40.5 kg)  10/19/17 89 lb 3.2 oz (40.5 kg)     GEN: Well nourished, well developed, in no acute distress NECK: No JVD; No carotid bruits CARDIAC: RRR, no murmurs, rubs, gallops RESPIRATORY:  Clear to auscultation without rales, wheezing or rhonchi  ABDOMEN: Soft, non-tender, non-distended, normal bowel sounds EXTREMITIES:  Warm and well perfused, no edema; No deformity, 2+ radial pulses PSYCH: Normal mood and affect   ASSESSMENT AND PLAN: .    #Large Pericardial Effusion - Originally incidentally noted on CT imaging and confirmed by echocardiography. - I independently reviewed the patient's echocardiogram and there are no echocardiographic findings of tamponade. - The patient is asymptomatic and has normal vital signs as well. - Is unclear to me exactly why the patient has a pericardial effusion at this time. - Given that she is hemodynamically stable and asymptomatic, we both agreed to pursue surveillance of her fusion at this time as opposed to pericardiocentesis. - I counseled the patient on signs and symptoms that would be concerning for the pericardial effusion getting worse and to have a very low threshold to notify me if she develops shortness of breath or tachycardia.  She verbalized an understanding. Complete echocardiogram in 6 months Follow with me in 6  months Check TSH, CRP, ESR  #HLD #Elevated LPA Recheck lipids next visit Continue Crestor  5 mg daily  #Non-Obs CAD - She is found to have minimal CAC on calcium  scoring.  Currently managing her with statin therapy.  #Lung Nodule - Incidentally noted lung  nodule on CT calcium  scoring. - Scheduled to undergo a repeat CT scan in July Follow-up CT scan         This note was written with the assistance of a dictation microphone or AI dictation software. Please excuse any typos or grammatical errors.   Signed, Georganna Archer, MD  03/30/2024 8:46 AM    [facility] "

## 2024-03-31 LAB — C-REACTIVE PROTEIN: CRP: <1 mg/L (ref 0–10)

## 2024-03-31 LAB — SEDIMENTATION RATE: Sed Rate: 2 mm/h (ref 0–40)

## 2024-03-31 LAB — TSH: TSH: 3.34 u[IU]/mL (ref 0.450–4.500)

## 2024-04-01 ENCOUNTER — Ambulatory Visit: Payer: Self-pay | Admitting: Student in an Organized Health Care Education/Training Program

## 2024-09-14 ENCOUNTER — Ambulatory Visit (HOSPITAL_COMMUNITY)

## 2024-09-15 ENCOUNTER — Other Ambulatory Visit (HOSPITAL_COMMUNITY)

## 2024-09-15 ENCOUNTER — Ambulatory Visit (HOSPITAL_COMMUNITY)
